# Patient Record
Sex: Male | Born: 1985 | Race: White | Hispanic: No | Marital: Married | Smoking: Current every day smoker
Health system: Southern US, Community
[De-identification: ages and names within clinical notes are randomized; demographics above are authoritative.]

## PROBLEM LIST (undated history)

## (undated) DIAGNOSIS — E119 Type 2 diabetes mellitus without complications: Secondary | ICD-10-CM

## (undated) DIAGNOSIS — I10 Essential (primary) hypertension: Secondary | ICD-10-CM

## (undated) DIAGNOSIS — F419 Anxiety disorder, unspecified: Secondary | ICD-10-CM

## (undated) HISTORY — PX: MOUTH SURGERY: SHX715

## (undated) HISTORY — DX: Type 2 diabetes mellitus without complications: E11.9

## (undated) NOTE — ED Provider Notes (Signed)
 Formatting of this note is different from the original.   EMERGENCY DEPARTMENT ENCOUNTER    CHIEF COMPLAINT   Chief Complaint  Patient presents with  ? Dental Pain    Lower left molar tooth cracked and painful. Thinks it is abscessed.  ? Hypertension    BP 227/116 at triage. Hx of HTN. Takes Lisinopril  and amlodipine  - no missed doses or recent medication changes. Pt states it is elevated d/t pain   HPI   Douglas Jackson is a 66 y.o. male who presents with left lower toothache that started last night and has progressively worsen.  Patient states that he broke that tooth months ago, but has never given him any issues.  Denies fevers or chills.  External Prior Records Reviewed:  none  Additional History Obtained:  none  PAST MEDICAL HISTORY   Past Medical History:  Diagnosis Date  ? Diabetes mellitus (HCC)   ? Hypertension   ? Personal history of kidney stones    SURGICAL HISTORY   Past Surgical History:  Procedure Laterality Date  ? WISDOM TOOTH EXTRACTION     CURRENT MEDICATIONS   No current facility-administered medications for this encounter.   Current Outpatient Medications  Medication Sig Dispense Refill  ? DULoxetine (CYMBALTA) 20 MG capsule Take 20 mg by mouth daily.    ? glipiZIDE  (GLUCOTROL ) 5 MG tablet Take 5 mg by mouth 2 (two) times daily before meals.    ? HYDROcodone -acetaminophen  (NORCO) 5-325 mg per tablet Take 1 tablet by mouth every 6 (six) hours as needed for Pain for up to 12 doses. 12 tablet 0  ? lisinopril  (PRINIVIL ,ZESTRIL ) 20 MG tablet Take 20 mg by mouth daily.    ? metFORMIN  (GLUCOPHAGE ) 500 MG tablet Take 500 mg by mouth 2 (two) times daily with meals.    ? methylPREDNISolone (MEDROL DOSEPACK) 4 mg tablet Follow instructions on package. 21 tablet 0  ? oxyCODONE  (ROXICODONE ) 5 MG immediate release tablet Take 1 tablet (5 mg total) by mouth every 6 (six) hours as needed. 15 tablet 0  ? tamsulosin  (FLOMAX ) 0.4 mg capsule Take 1 capsule (0.4 mg  total) by mouth daily for 7 days. 7 capsule 0  ? valACYclovir (VALTREX) 1000 MG tablet Take 1 tablet (1,000 mg total) by mouth 3 (three) times daily for 7 days. 21 tablet 0   ALLERGIES   Allergies  Allergen Reactions  ? Toradol  [Ketorolac ] Stevens-Johnson syndrome  ? Prednisone Hives   FAMILY HISTORY   No family history on file.  SOCIAL HISTORY   Social History   Socioeconomic History  ? Marital status: Married    Spouse name: Not on file  ? Number of children: Not on file  ? Years of education: Not on file  ? Highest education level: Not on file  Occupational History  ? Not on file  Tobacco Use  ? Smoking status: Every Day    Packs/day: 0.50    Years: 10.00    Pack years: 5.00    Types: Cigarettes  ? Smokeless tobacco: Never  Vaping Use  ? Vaping Use: Never used  Substance and Sexual Activity  ? Alcohol use: Not Currently  ? Drug use: Yes    Types: Marijuana  ? Sexual activity: Yes    Partners: Female  Other Topics Concern  ? Not on file  Social History Narrative  ? Not on file   Social Determinants of Health   Financial Resource Strain: Not on file  Food Insecurity: Not on file  Transportation Needs: Not on file  Social Connections: Not on file  Housing Stability: Not on file   REVIEW OF SYSTEMS   See HPI for further details.  All other systems reviewed and otherwise negative.  PHYSICAL EXAM   VITAL SIGNS: BP (S) (!) 227/116 (BP Location: Right upper arm, Patient Position: Sitting)   Pulse 94   Temp 98.3 F (36.8 C) (Oral)   Resp 18   Ht 5' 9 (1.753 m)   Wt 245 lb (111 kg)   SpO2 100%   BMI 36.18 kg/m  Constitutional:  Well developed and well nourished.  No acute distress.  Non-toxic in appearance.   Eyes:  PERRL. EOMI.  Conjunctiva normal.  No discharge. HENT:  Atraumatic.  Normocephalic.  Ears normal bilaterally.  Nares clear bilaterally.  Normal oropharynx without erythema.  Obvious dental caries to left lower back molar.  No swelling or  fluctuance.  No facial swelling or erythema. Neck:  Normal inspection.  Normal range of motion.  Supple.  No meningeal signs.  No lymphadenopathy. Respiratory:  Normal breath sounds.  No respiratory distress.  No wheezing or rhonchi.  No chest tenderness.  Cardiovascular:  Normal heart rate and rhythm.  Equal pulses. Extremities:  Intact distal pulses.  No edema.  No tenderness.  No cyanosis.  Normal range of motion in all major joints. No tenderness to palpation.  No major deformities noted.  Back: No midline or CVA tenderness.  Skin:  Warm and dry.  No erythema.  No rash.  Neurologic:  Awake and alert.  GCS 15.  ED COURSE & MEDICAL DECISION MAKING   46 year old male patient here with left lower toothache that radiates into his left ear that started last night.  He has a known dental caries in that area.  He has not been on antibiotics recently.  He was also found to have elevated blood pressure.  Patient reports taking his medications as prescribed.  Patient states that his blood pressure typically goes up when he is in pain.  Denies chest pain or shortness of breath.  Denies headache.  No indication for further workup it for his blood pressure.  He was given hydrocodone  while in the emergency department.  He is afebrile.  Stable for discharge home.  He was instructed to follow up with his primary care physician for hypertension and dentist as scheduled.  Differential diagnosis includes, but not limited to dental abscess, facial cellulitis,  Medications Provided in Emergency Department:  Hydrocodone   Prescriptions Provided at Discharge:  Amoxicillin  Social Determinants of Health/Special Considerations:  None identified  Disposition:  Stable for discharge home  FINAL IMPRESSION   Dental abscess Hypertension  Portions of this note may be dictated using voice recognition software.  Variances in spelling and vocabulary are possible and unintentional.  Not all errors are caught/corrected.   Please notify the dino if any discrepancies are noted or if the meaning of any statement is not clear.  Wanda Servant, NP 03/14/21 1502  Cosigned by Gwynne KATHEE Koleen DOUGLAS, MD at 03/14/2021  7:22 PM EST Electronically signed by Wanda Servant, NP at 03/14/2021  3:02 PM EST Electronically signed by Gwynne KATHEE Koleen DOUGLAS, MD at 03/14/2021  7:22 PM EST

---

## 2009-04-02 ENCOUNTER — Emergency Department (HOSPITAL_COMMUNITY): Admission: EM | Admit: 2009-04-02 | Discharge: 2009-04-02 | Payer: Self-pay | Admitting: Emergency Medicine

## 2013-09-01 ENCOUNTER — Emergency Department (HOSPITAL_COMMUNITY)
Admission: EM | Admit: 2013-09-01 | Discharge: 2013-09-01 | Disposition: A | Discharge status: Home / Self Care | Payer: Self-pay | Attending: Emergency Medicine | Admitting: Emergency Medicine

## 2013-09-01 ENCOUNTER — Encounter (HOSPITAL_COMMUNITY): Payer: Self-pay | Admitting: Emergency Medicine

## 2013-09-01 ENCOUNTER — Emergency Department (HOSPITAL_COMMUNITY): Payer: Self-pay

## 2013-09-01 DIAGNOSIS — Z87891 Personal history of nicotine dependence: Secondary | ICD-10-CM | POA: Insufficient documentation

## 2013-09-01 DIAGNOSIS — Y9301 Activity, walking, marching and hiking: Secondary | ICD-10-CM | POA: Insufficient documentation

## 2013-09-01 DIAGNOSIS — Y9289 Other specified places as the place of occurrence of the external cause: Secondary | ICD-10-CM | POA: Insufficient documentation

## 2013-09-01 DIAGNOSIS — Z8659 Personal history of other mental and behavioral disorders: Secondary | ICD-10-CM | POA: Insufficient documentation

## 2013-09-01 DIAGNOSIS — S46909A Unspecified injury of unspecified muscle, fascia and tendon at shoulder and upper arm level, unspecified arm, initial encounter: Secondary | ICD-10-CM | POA: Insufficient documentation

## 2013-09-01 DIAGNOSIS — I10 Essential (primary) hypertension: Secondary | ICD-10-CM | POA: Insufficient documentation

## 2013-09-01 DIAGNOSIS — S4980XA Other specified injuries of shoulder and upper arm, unspecified arm, initial encounter: Secondary | ICD-10-CM | POA: Insufficient documentation

## 2013-09-01 DIAGNOSIS — W010XXA Fall on same level from slipping, tripping and stumbling without subsequent striking against object, initial encounter: Secondary | ICD-10-CM | POA: Insufficient documentation

## 2013-09-01 DIAGNOSIS — M25511 Pain in right shoulder: Secondary | ICD-10-CM

## 2013-09-01 HISTORY — DX: Essential (primary) hypertension: I10

## 2013-09-01 HISTORY — DX: Anxiety disorder, unspecified: F41.9

## 2013-09-01 MED ORDER — HYDROCODONE-ACETAMINOPHEN 5-325 MG PO TABS: 1.0000 | ORAL_TABLET | ORAL | Status: DC | PRN

## 2013-09-01 NOTE — ED Notes (Signed)
C/o right shoulder pain since fall 2 days ago. States he slipped in the kitchen.

## 2013-09-01 NOTE — ED Notes (Signed)
No XL slings in stock. Called ortho tech.

## 2013-09-01 NOTE — ED Provider Notes (Signed)
Medical screening examination/treatment/procedure(s) were performed by non-physician practitioner and as supervising physician I was immediately available for consultation/collaboration.   EKG Interpretation None       Rigby Leonhardt M Marty Sadlowski, MD 09/01/13 2229 

## 2013-09-01 NOTE — Progress Notes (Signed)
Orthopedic Tech Progress Note Patient Details:  Douglas Jackson 04/04/1985 [id number]  Ortho Devices Type of Ortho Device: Arm sling Ortho Device/Splint Location: RUE Ortho Device/Splint Interventions: Application   Asia R Thompson 09/01/2013, 12:54 PM

## 2013-09-01 NOTE — ED Provider Notes (Signed)
CSN: [id number]     Arrival date & time 09/01/13  1117 History  This chart was scribed for Douglas DredgeEmily Ardyn Forge, PA-C, non-physician practitioner working with Hurman HornJohn M Bednar, MD by Nicholos Johnsenise Iheanachor, ED scribe. This patient was seen in room TR06C/TR06C and the patient's care was started at 12:27 PM.    Chief Complaint  Patient presents with  . Shoulder Injury   The history is provided by the patient. No language interpreter was used.   HPI Comments: Douglas Jackson is a 28 y.o. male who presents to the Emergency Department complaining of right shoulder injury; onset 3 days ago. No LOC or head injury. Notes a constant burning diffuse pain with or without movement. Pain is exacerbated by movement. . States he slipped on water walking in the kitchen. [name] backwards and landed on shoulder. No other injuries from this fall. Has been using ibuprofen to treat without relief. Not followed by an orthopedist. Denies numbness or weakness.  Past Medical History  Diagnosis Date  . Hypertension   . Anxiety    History reviewed. No pertinent past surgical history. History reviewed. No pertinent family history. History  Substance Use Topics  . Smoking status: Former Games developermoker  . Smokeless tobacco: Not on file  . Alcohol Use: No    Review of Systems  Musculoskeletal: Positive for arthralgias.  Neurological: Negative for weakness and numbness.  All other systems reviewed and are negative.   Allergies  Celestone  Home Medications   Prior to Admission medications   Not on File   Triage vitals: BP 139/87  Pulse 113  Temp(Src) 98.3 F (36.8 C) (Oral)  Resp 20  Ht 5\' 9"  (1.753 m)  Wt 268 lb (121.564 kg)  BMI 39.56 kg/m2  SpO2 97%  Physical Exam  Nursing note and vitals reviewed. Constitutional: He appears well-developed and well-nourished. No distress.  HENT:  Head: Normocephalic and atraumatic.  Neck: Neck supple.  Pulmonary/Chest: Effort normal.  Musculoskeletal:       Right shoulder: He exhibits  tenderness and pain. He exhibits normal range of motion.  Diffuse tenderness throughout upper right shoulder and upper right back. Clavicle non-tender. Tender over AC joint. Upper extremities: Strength 5/5, sensation intact, distal pulses intact.     Neurological: He is alert.  Skin: He is not diaphoretic.    ED Course  Procedures (including critical care time) DIAGNOSTIC STUDIES: Oxygen Saturation is 97% on room air, normal by my interpretation.    COORDINATION OF CARE: At 12:33 PM: Discussed treatment plan with patient which includes prescription for pain medication. Pt will be placed in a right arm sling to immobilize the shoulder. Discussed with patient to continue icing and elevating the area to decrease swelling and encourage healing. Patient agrees.    Labs Review Labs Reviewed - No data to display  Imaging Review Dg Shoulder Right  09/01/2013   CLINICAL DATA:  Pain post trauma  EXAM: RIGHT SHOULDER - 2+ VIEW  COMPARISON:  None.  FINDINGS: Frontal, Y scapular, and axillary images were obtained. No fracture or dislocation. Joint spaces appear intact. No erosive change.  IMPRESSION: No abnormality noted.   Electronically Signed   By: Bretta BangWilliam  Woodruff M.D.   On: 09/01/2013 12:20    EKG Interpretation None      MDM   Final diagnoses:  Right shoulder pain    Afebrile nontoxic patient with diffuse right shoulder pain without overlying skin changes, ecchymosis, abrasion or laceration.  Xray is negative.  Neurovascularly intact.  Tender everywhere, including  over [facility] joint.  Placed in sling.  D/C home with pain medication. PCP follow up.  Discussed result, findings, treatment, and follow up  with patient.  Pt given return precautions.  Pt verbalizes understanding and agrees with plan.      I personally performed the services described in this documentation, which was scribed in my presence. The recorded information has been reviewed and is accurate.     Douglas Dredge, PA-C 09/01/13  1320

## 2013-09-01 NOTE — ED Notes (Signed)
Per pt sts that he fell on his right shoulder 3 days ago. sts slipped in the kitchen. Limited movement due to pain. sts also that his nose has been bleeding randomly. No bleeding currently.

## 2013-09-01 NOTE — Discharge Instructions (Signed)
Read the information below.  Use the prescribed medication as directed.  Please discuss all new medications with your pharmacist.  Do not take additional tylenol while taking the prescribed pain medication to avoid overdose.  You may return to the Emergency Department at any time for worsening condition or any new symptoms that concern you.  If you develop uncontrolled pain, weakness or numbness of the extremity, severe discoloration of the skin, or you are unable to move your arm, return to the ER for a recheck.      Acromioclavicular Injuries The AC (acromioclavicular) joint is the joint in the shoulder where the collarbone (clavicle) meets the shoulder blade (scapula). The part of the shoulder blade connected to the collarbone is called the acromion. Common problems with and treatments for the [facility] joint are detailed below. ARTHRITIS Arthritis occurs when the joint has been injured and the smooth padding between the joints (cartilage) is lost. This is the wear and tear seen in most joints of the body if they have been overused. This causes the joint to produce pain and swelling which is worse with activity.  AC JOINT SEPARATION AC joint separation means that the ligaments connecting the acromion of the shoulder blade and collarbone have been damaged, and the two bones no longer line up. AC separations can be anywhere from mild to severe, and are "graded" depending upon which ligaments are torn and how badly they are torn.  Grade I Injury: the least damage is done, and the [facility] joint still lines up.  Grade II Injury: damage to the ligaments which reinforce the [facility] joint. In a Grade II injury, these ligaments are stretched but not entirely torn. When stressed, the [facility] joint becomes painful and unstable.  Grade III Injury: AC and secondary ligaments are completely torn, and the collarbone is no longer attached to the shoulder blade. This results in deformity; a prominence of the end of the clavicle. AC  JOINT FRACTURE AC joint fracture means that there has been a break in the bones of the [facility] joint, usually the end of the clavicle. TREATMENT TREATMENT OF AC ARTHRITIS  There is currently no way to replace the cartilage damaged by arthritis. The best way to improve the condition is to decrease the activities which aggravate the problem. Application of ice to the joint helps decrease pain and soreness (inflammation). The use of non-steroidal anti-inflammatory medication is helpful.  If less conservative measures do not work, then cortisone shots (injections) may be used. These are anti-inflammatories; they decrease the soreness in the joint and swelling.  If non-surgical measures fail, surgery may be recommended. The procedure is generally removal of a portion of the end of the clavicle. This is the part of the collarbone closest to your acromion which is stabilized with ligaments to the acromion of the shoulder blade. This surgery may be performed using a tube-like instrument with a light (arthroscope) for looking into a joint. It may also be performed as an open surgery through a small incision by the surgeon. Most patients will have good range of motion within 6 weeks and may return to all activity including sports by 8-12 weeks, barring complications. TREATMENT OF AN AC SEPARATION  The initial treatment is to decrease pain. This is best accomplished by immobilizing the arm in a sling and placing an ice pack to the shoulder for 20 to 30 minutes every 2 hours as needed. As the pain starts to subside, it is important to begin moving the fingers, wrist,  elbow and eventually the shoulder in order to prevent a stiff or "frozen" shoulder. Instruction on when and how much to move the shoulder will be provided by your caregiver. The length of time needed to regain full motion and function depends on the amount or grade of the injury. Recovery from a Grade I AC separation usually takes 10 to 14 days, whereas a  Grade III may take 6 to 8 weeks.  Grade I and II separations usually do not require surgery. Even Grade III injuries usually allow return to full activity with few restrictions. Treatment is also based on the activity demands of the injured shoulder. For example, a high level quarterback with an injured throwing arm will receive more aggressive treatment than someone with a desk job who rarely uses his/her arm for strenuous activities. In some cases, a painful lump may persist which could require a later surgery. Surgery can be very successful, but the benefits must be weighed against the potential risks. TREATMENT OF AN AC JOINT FRACTURE Fracture treatment depends on the type of fracture. Sometimes a splint or sling may be all that is required. Other times surgery may be required for repair. This is more frequently the case when the ligaments supporting the clavicle are completely torn. Your caregiver will help you with these decisions and together you can decide what will be the best treatment. HOME CARE INSTRUCTIONS   Apply ice to the injury for 15-20 minutes each hour while awake for 2 days. Put the ice in a plastic bag and place a towel between the bag of ice and skin.  If a sling has been applied, wear it constantly for as long as directed by your caregiver, even at night. The sling or splint can be removed for bathing or showering or as directed. Be sure to keep the shoulder in the same place as when the sling is on. Do not lift the arm.  If a figure-of-eight splint has been applied it should be tightened gently by another person every day. Tighten it enough to keep the shoulders held back. Allow enough room to place the index finger between the body and strap. Loosen the splint immediately if there is numbness or tingling in the hands.  Take over-the-counter or prescription medicines for pain, discomfort or fever as directed by your caregiver.  If you or your child has received a follow up  appointment, it is very important to keep that appointment in order to avoid long term complications, chronic pain or disability. SEEK MEDICAL CARE IF:   The pain is not relieved with medications.  There is increased swelling or discoloration that continues to get worse rather than better.  You or your child has been unable to follow up as instructed.  There is progressive numbness and tingling in the arm, forearm or hand. SEEK IMMEDIATE MEDICAL CARE IF:   The arm is numb, cold or pale.  There is increasing pain in the hand, forearm or fingers. MAKE SURE YOU:   Understand these instructions.  Will watch your condition.  Will get help right away if you are not doing well or get worse. Document Released: 11/02/2004 Document Revised: 04/17/2011 Document Reviewed: 04/27/2008 [facility] Patient Information 2015 [place], [place]. This information is not intended to replace advice given to you by your health care provider. Make sure you discuss any questions you have with your health care provider.  Shoulder Pain The shoulder is the joint that connects your arms to your body. The bones that  form the shoulder joint include the upper arm bone (humerus), the shoulder blade (scapula), and the collarbone (clavicle). The top of the humerus is shaped like a ball and fits into a rather flat socket on the scapula (glenoid cavity). A combination of muscles and strong, fibrous tissues that connect muscles to bones (tendons) support your shoulder joint and hold the ball in the socket. Small, fluid-filled sacs (bursae) are located in different areas of the joint. They act as cushions between the bones and the overlying soft tissues and help reduce friction between the gliding tendons and the bone as you move your arm. Your shoulder joint allows a wide range of motion in your arm. This range of motion allows you to do things like scratch your back or throw a ball. However, this range of motion also makes your  shoulder more prone to pain from overuse and injury. Causes of shoulder pain can originate from both injury and overuse and usually can be grouped in the following four categories:  Redness, swelling, and pain (inflammation) of the tendon (tendinitis) or the bursae (bursitis).  Instability, such as a dislocation of the joint.  Inflammation of the joint (arthritis).  Broken bone (fracture). HOME CARE INSTRUCTIONS   Apply ice to the sore area.  Put ice in a plastic bag.  Place a towel between your skin and the bag.  Leave the ice on for 15-20 minutes, 3-4 times per day for the first 2 days, or as directed by your health care provider.  Stop using cold packs if they do not help with the pain.  If you have a shoulder sling or immobilizer, wear it as long as your caregiver instructs. Only remove it to shower or bathe. Move your arm as little as possible, but keep your hand moving to prevent swelling.  Squeeze a soft ball or foam pad as much as possible to help prevent swelling.  Only take over-the-counter or prescription medicines for pain, discomfort, or fever as directed by your caregiver. SEEK MEDICAL CARE IF:   Your shoulder pain increases, or new pain develops in your arm, hand, or fingers.  Your hand or fingers become cold and numb.  Your pain is not relieved with medicines. SEEK IMMEDIATE MEDICAL CARE IF:   Your arm, hand, or fingers are numb or tingling.  Your arm, hand, or fingers are significantly swollen or turn white or blue. MAKE SURE YOU:   Understand these instructions.  Will watch your condition.  Will get help right away if you are not doing well or get worse. Document Released: 11/02/2004 Document Revised: 06/09/2013 Document Reviewed: 01/07/2011 [facility] Patient Information 2015 [place], [place]. This information is not intended to replace advice given to you by your health care provider. Make sure you discuss any questions you have with your health care  provider.  Shoulder Range of Motion Exercises The shoulder is the most flexible joint in the human body. Because of this it is also the most unstable joint in the body. All ages can develop shoulder problems. Early treatment of problems is necessary for a good outcome. People react to shoulder pain by decreasing the movement of the joint. After a brief period of time, the shoulder can become "frozen". This is an almost complete loss of the ability to move the damaged shoulder. Following injuries your caregivers can give you instructions on exercises to keep your range of motion (ability to move your shoulder freely), or regain it if it has been lost.  EXERCISES EXERCISES  TO MAINTAIN THE MOBILITY OF YOUR SHOULDER: Codman's Exercise or Pendulum Exercise  This exercise may be performed in a prone (face-down) lying position or standing while leaning on a chair with the opposite arm. Its purpose is to relax the muscles in your shoulder and slowly but surely increase the range of motion and to relieve pain.  Lie on your stomach close to the side edge of the bed. Let your weak arm hang over the edge of the bed. Relax your shoulder, arm and hand. Let your shoulder blade relax and drop down.  Slowly and gently swing your arm forward and back. Do not use your neck muscles; relax them. It might be easier to have someone else gently start swinging your arm.  As pain decreases, increase your swing. To start, arm swing should begin at 15 degree angles. In time and as pain lessens, move to 30-45 degree angles. Start with swinging for about 15 seconds, and work towards swinging for 3 to 5 minutes.  This exercise may also be performed in a standing/bent over position.  Stand and hold onto a sturdy chair with your good arm. Bend forward at the waist and bend your knees slightly to help protect your back. Relax your weak arm, let it hang limp. Relax your shoulder blade and let it drop.  Keep your shoulder relaxed  and use body motion to swing your arm in small circles.  Stand up tall and relax.  Repeat motion and change direction of circles.  Start with swinging for about 30 seconds, and work towards swinging for 3 to 5 minutes. STRETCHING EXERCISES:  Lift your arm out in front of you with the elbow bent at 90 degrees. Using your other arm gently pull the elbow forward and across your body.  Bend one arm behind you with the palm facing outward. Using the other arm, hold a towel or rope and reach this arm up above your head, then bend it at the elbow to move your wrist to behind your neck. Grab the free end of the towel with the hand behind your back. Gently pull the towel up with the hand behind your neck, gradually increasing the pull on the hand behind the small of your back. Then, gradually pull down with the hand behind the small of your back. This will pull the hand and arm behind your neck further. Both shoulders will have an increased range of motion with repetition of this exercise. STRENGTHENING EXERCISES:  Standing with your arm at your side and straight out from your shoulder with the elbow bent at 90 degrees, hold onto a small weight and slowly raise your hand so it points straight up in the air. Repeat this five times to begin with, and gradually increase to ten times. Do this four times per day. As you grow stronger you can gradually increase the weight.  Repeat the above exercise, only this time using an elastic band. Start with your hand up in the air and pull down until your hand is by your side. As you grow stronger, gradually increase the amount you pull by increasing the number or size of the elastic bands. Use the same amount of repetitions.  Standing with your hand at your side and holding onto a weight, gradually lift the hand in front of you until it is over your head. Do the same also with the hand remaining at your side and lift the hand away from your body until it is again over  your head. Repeat this five times to begin with, and gradually increase to ten times. Do this four times per day. As you grow stronger you can gradually increase the weight. Document Released: 10/22/2002 Document Revised: 01/28/2013 Document Reviewed: 01/23/2005 [facility] Patient Information 2015 [place], [place]. This information is not intended to replace advice given to you by your health care provider. Make sure you discuss any questions you have with your health care provider.

## 2013-12-13 ENCOUNTER — Emergency Department (HOSPITAL_COMMUNITY): Payer: Self-pay

## 2013-12-13 ENCOUNTER — Emergency Department (HOSPITAL_COMMUNITY)
Admission: EM | Admit: 2013-12-13 | Discharge: 2013-12-13 | Disposition: A | Discharge status: Home / Self Care | Payer: Self-pay | Attending: Emergency Medicine | Admitting: Emergency Medicine

## 2013-12-13 ENCOUNTER — Encounter (HOSPITAL_COMMUNITY): Payer: Self-pay | Admitting: *Deleted

## 2013-12-13 DIAGNOSIS — R112 Nausea with vomiting, unspecified: Secondary | ICD-10-CM | POA: Insufficient documentation

## 2013-12-13 DIAGNOSIS — R739 Hyperglycemia, unspecified: Secondary | ICD-10-CM | POA: Insufficient documentation

## 2013-12-13 DIAGNOSIS — R059 Cough, unspecified: Secondary | ICD-10-CM

## 2013-12-13 DIAGNOSIS — R1084 Generalized abdominal pain: Secondary | ICD-10-CM | POA: Insufficient documentation

## 2013-12-13 DIAGNOSIS — R05 Cough: Secondary | ICD-10-CM

## 2013-12-13 DIAGNOSIS — J208 Acute bronchitis due to other specified organisms: Secondary | ICD-10-CM | POA: Insufficient documentation

## 2013-12-13 DIAGNOSIS — I10 Essential (primary) hypertension: Secondary | ICD-10-CM | POA: Insufficient documentation

## 2013-12-13 DIAGNOSIS — F419 Anxiety disorder, unspecified: Secondary | ICD-10-CM | POA: Insufficient documentation

## 2013-12-13 DIAGNOSIS — Z79899 Other long term (current) drug therapy: Secondary | ICD-10-CM | POA: Insufficient documentation

## 2013-12-13 DIAGNOSIS — Z720 Tobacco use: Secondary | ICD-10-CM | POA: Insufficient documentation

## 2013-12-13 DIAGNOSIS — R197 Diarrhea, unspecified: Secondary | ICD-10-CM | POA: Insufficient documentation

## 2013-12-13 LAB — CBG MONITORING, ED: Glucose-Capillary: 196 mg/dL — ABNORMAL HIGH (ref 70–99)

## 2013-12-13 LAB — COMPREHENSIVE METABOLIC PANEL
ALBUMIN: 3.7 g/dL (ref 3.5–5.2)
ALK PHOS: 160 U/L — AB (ref 39–117)
ALT: 109 U/L — AB (ref 0–53)
AST: 41 U/L — ABNORMAL HIGH (ref 0–37)
Anion gap: 13 (ref 5–15)
BILIRUBIN TOTAL: 0.3 mg/dL (ref 0.3–1.2)
BUN: 10 mg/dL (ref 6–23)
CHLORIDE: 97 meq/L (ref 96–112)
CO2: 25 meq/L (ref 19–32)
CREATININE: 0.67 mg/dL (ref 0.50–1.35)
Calcium: 9.3 mg/dL (ref 8.4–10.5)
GFR calc Af Amer: >90 mL/min (ref >90)
Glucose, Bld: 256 mg/dL — ABNORMAL HIGH (ref 70–99)
POTASSIUM: 3.8 meq/L (ref 3.7–5.3)
SODIUM: 135 meq/L — AB (ref 137–147)
Total Protein: 8.2 g/dL (ref 6.0–8.3)

## 2013-12-13 LAB — CBC WITH DIFFERENTIAL/PLATELET
BASOS ABS: 0 10*3/uL (ref 0.0–0.1)
BASOS PCT: 0 % (ref 0–1)
Eosinophils Absolute: 0.3 10*3/uL (ref 0.0–0.7)
Eosinophils Relative: 2 % (ref 0–5)
HEMATOCRIT: 45 % (ref 39.0–52.0)
Hemoglobin: 15.7 g/dL (ref 13.0–17.0)
LYMPHS PCT: 39 % (ref 12–46)
Lymphs Abs: 4.3 10*3/uL — ABNORMAL HIGH (ref 0.7–4.0)
MCH: 28.9 pg (ref 26.0–34.0)
MCHC: 34.9 g/dL (ref 30.0–36.0)
MCV: 82.9 fL (ref 78.0–100.0)
MONO ABS: 0.6 10*3/uL (ref 0.1–1.0)
Monocytes Relative: 6 % (ref 3–12)
NEUTROS ABS: 6 10*3/uL (ref 1.7–7.7)
NEUTROS PCT: 53 % (ref 43–77)
Platelets: 245 10*3/uL (ref 150–400)
RBC: 5.43 MIL/uL (ref 4.22–5.81)
RDW: 12.4 % (ref 11.5–15.5)
WBC: 11.2 10*3/uL — AB (ref 4.0–10.5)

## 2013-12-13 LAB — LIPASE, BLOOD: Lipase: 27 U/L (ref 11–59)

## 2013-12-13 MED ORDER — ONDANSETRON HCL 4 MG/2ML IJ SOLN
4.0000 mg | Freq: Once | INTRAMUSCULAR | Status: AC
  Administered 2013-12-13: 4 mg via INTRAVENOUS
  Filled 2013-12-13: qty 2

## 2013-12-13 MED ORDER — KETOROLAC TROMETHAMINE 30 MG/ML IJ SOLN
30.0000 mg | Freq: Once | INTRAMUSCULAR | Status: AC
  Administered 2013-12-13: 30 mg via INTRAVENOUS
  Filled 2013-12-13: qty 1

## 2013-12-13 MED ORDER — ALBUTEROL SULFATE HFA 108 (90 BASE) MCG/ACT IN AERS: 2.0000 | INHALATION_SPRAY | RESPIRATORY_TRACT | Status: DC | PRN

## 2013-12-13 MED ORDER — ONDANSETRON HCL 4 MG PO TABS: 4.0000 mg | ORAL_TABLET | Freq: Three times a day (TID) | ORAL | Status: DC | PRN

## 2013-12-13 NOTE — ED Notes (Signed)
He also has anger issues he takes med for this

## 2013-12-13 NOTE — ED Notes (Signed)
The pt has numerus complaints for 2-3 weeks.  He is c/o vomiting pink chunks  Cold chest congestion  inknown temp productive cough.  He is currently a smoker and not feeling well

## 2013-12-13 NOTE — ED Notes (Signed)
CBG 196 

## 2013-12-13 NOTE — ED Notes (Signed)
NP at BS.

## 2013-12-13 NOTE — Discharge Instructions (Signed)
Please follow the directions provided. Be sure to follow-up with your primary care provider this week to follow-up on your symptoms and to follow-up on your blood sugar today of 256.  You may use the inhaler 2 puffs every 4 hours for coughing or shortness of breath.  Don't hesitate to return for new, worsening or concerning symptoms.     SEEK IMMEDIATE MEDICAL CARE IF:  You are unable to keep fluids down.  You have persistent vomiting.  You have blood in your stool, or your stools are black and tarry.  You do not urinate in 6-8 hours, or there is only a small amount of very dark urine.  You have abdominal pain that increases or localizes.  You have weakness, dizziness, confusion, or light-headedness.  You have a severe headache.  Your diarrhea gets worse or does not get better.  You have a fever or persistent symptoms for more than 2-3 days.  You have a fever and your symptoms suddenly get worse.

## 2013-12-13 NOTE — ED Provider Notes (Signed)
CSN: [id number]     Arrival date & time 12/13/13  0257 History   First MD Initiated Contact with Patient 12/13/13 [id number]     Chief Complaint  Patient presents with  . multiple complaints    . Emesis  . Cough    (Consider location/radiation/quality/duration/timing/severity/associated sxs/prior Treatment) HPI  Douglas Jackson is a a 28 yo male presenting with report of diarrhea x 2-3 weeks.  He reports 3-4 episodes of diarrhea per day daily.  Then 1 week ago he began to feel nauseated and began having episodes of vomiting.  Three days ago he had a episode of vomiting and then again last night while at work.  His bowel movements have been less watery and becoming softer. He also reports he has been coughing and nasal congestion x several weeks.  He feels like the cough could be productive if he could cough hard enough.  Pt denies abd pain except when having episode of diarrhea, chest pain, shortness of breath, bilious or bloody emesis or hematachezia or melena.    Past Medical History  Diagnosis Date  . Hypertension   . Anxiety    History reviewed. No pertinent past surgical history. No family history on file. History  Substance Use Topics  . Smoking status: Current Every Day Smoker  . Smokeless tobacco: Not on file  . Alcohol Use: Yes    Review of Systems  Constitutional: Positive for fever. Negative for chills.  HENT: Positive for congestion and rhinorrhea. Negative for sore throat.   Eyes: Negative for visual disturbance.  Respiratory: Positive for cough. Negative for shortness of breath.   Cardiovascular: Negative for chest pain and leg swelling.  Gastrointestinal: Positive for nausea, vomiting and diarrhea.  Genitourinary: Negative for dysuria.  Musculoskeletal: Negative for myalgias.  Skin: Negative for rash.  Neurological: Negative for weakness, numbness and headaches.     Allergies  Celestone  Home Medications   Prior to Admission medications   Medication Sig Start  Date End Date Taking? Authorizing Provider  lisinopril (PRINIVIL,ZESTRIL) 20 MG tablet Take 20 mg by mouth daily.   Yes Historical Provider, MD  sertraline (ZOLOFT) 50 MG tablet Take 50 mg by mouth daily.  10/30/13  Yes Historical Provider, MD  HYDROcodone-acetaminophen (NORCO/VICODIN) 5-325 MG per tablet Take 1 tablet by mouth every 4 (four) hours as needed for moderate pain or severe pain. 09/01/13   Trixie DredgeEmily West, PA-C   BP 123/58 mmHg  Pulse 85  Temp(Src) 98.1 F (36.7 C) (Oral)  Resp 20  SpO2 98% Physical Exam  Constitutional: He is oriented to person, place, and time. He appears well-developed and well-nourished. No distress.  HENT:  Head: Normocephalic and atraumatic.  Mouth/Throat: Oropharynx is clear and moist. No oropharyngeal exudate.  Eyes: Conjunctivae are normal. No scleral icterus.  Neck: Neck supple. No thyromegaly present.  Cardiovascular: Normal rate, regular rhythm and intact distal pulses.  Exam reveals no gallop and no friction rub.   No murmur heard. Pulmonary/Chest: Effort normal and breath sounds normal. No respiratory distress. He has no wheezes. He has no rales. He exhibits no tenderness.  Abdominal: Soft. Normal appearance. There is no hepatosplenomegaly. There is generalized tenderness. There is no rigidity, no rebound, no guarding, no CVA tenderness, no tenderness at McBurney's point and negative Murphy's sign.  Musculoskeletal: He exhibits no tenderness.  Lymphadenopathy:    He has no cervical adenopathy.  Neurological: He is alert and oriented to person, place, and time.  Skin: Skin is warm and dry. No rash  noted. He is not diaphoretic.  Psychiatric: He has a normal mood and affect.  Nursing note and vitals reviewed.   ED Course  Procedures (including critical care time) Labs Review Labs Reviewed  CBC WITH DIFFERENTIAL - Abnormal; Notable for the following:    WBC 11.2 (*)    Lymphs Abs 4.3 (*)    All other components within normal limits   COMPREHENSIVE METABOLIC PANEL - Abnormal; Notable for the following:    Sodium 135 (*)    Glucose, Bld 256 (*)    AST 41 (*)    ALT 109 (*)    Alkaline Phosphatase 160 (*)    All other components within normal limits  CBG MONITORING, ED - Abnormal; Notable for the following:    Glucose-Capillary 196 (*)    All other components within normal limits  LIPASE, BLOOD    Imaging Review Dg Chest 2 View  12/13/2013   CLINICAL DATA:  Initial evaluation for chest congestion, cough.  EXAM: CHEST  2 VIEW  COMPARISON:  None.  FINDINGS: The cardiac and mediastinal silhouettes are within normal limits.  The lungs are normally inflated. No airspace consolidation, pleural effusion, or pulmonary edema is identified. There is no pneumothorax.  No acute osseous abnormality identified.  IMPRESSION: No active cardiopulmonary disease.   Electronically Signed   By: Rise MuBenjamin  McClintock M.D.   On: 12/13/2013 04:25     EKG Interpretation None      MDM   Final diagnoses:  Nausea vomiting and diarrhea  Viral bronchitis  Hyperglycemia   28 yo with symptoms consistent with viral gastroenteritis. His vitals are stable, and is afebrile. His labs are unremarkable except for his glucose was 256. This was reported to the pt and he was made aware he needs to follow-up with his PCP regarding this finding.  He was given a NS bolus in the ED. His lungs are clear, there is no focal abdominal pain, no concern for appendicitis, cholecystitis, pancreatitis, ruptured viscus, UTI, kidney stone, or any other abdominal etiology.  He also reports nasal congestion and non-productive cough will provide albuterol MDI as supportive therapy. Discharge instructions include prescription for nausea medicine, follow-up with PCP and return if symptoms worsen.  Patient aware of plan and in agreement.  Return precautions provided.     Filed Vitals:   12/13/13 0554 12/13/13 0600 12/13/13 0630 12/13/13 0713  BP: 120/65 123/58  133/61   Pulse: 79 80 85 74  Temp:      TempSrc:      Resp:  20 20 18   SpO2: 99% 98% 98% 99%   Meds given in ED:  Medications  ketorolac (TORADOL) 30 MG/ML injection 30 mg (30 mg Intravenous Given 12/13/13 0711)  ondansetron (ZOFRAN) injection 4 mg (4 mg Intravenous Given 12/13/13 0711)    Discharge Medication List as of 12/13/2013  7:20 AM      Hide    Start     Ordered   12/13/13 0000  ondansetron (ZOFRAN) 4 MG tablet Every 8 hours PRN Discontinue Reprint 12/13/13 0720   12/13/13 0000  albuterol (PROVENTIL HFA;VENTOLIN HFA) 108 (90 BASE) MCG/ACT inhaler Every 4 hours PRN Discontinue Reprint 12/13/13 0720          Harle BattiestElizabeth Athens Lebeau, NP 12/13/13 [id number]  Olivia Mackielga M Otter, MD 12/13/13 1759

## 2015-09-10 ENCOUNTER — Encounter (HOSPITAL_COMMUNITY): Payer: Self-pay | Admitting: *Deleted

## 2015-09-10 ENCOUNTER — Emergency Department (HOSPITAL_COMMUNITY)
Admission: EM | Admit: 2015-09-10 | Discharge: 2015-09-11 | Disposition: A | Discharge status: Home / Self Care | Payer: Self-pay | Attending: Emergency Medicine | Admitting: Emergency Medicine

## 2015-09-10 DIAGNOSIS — G629 Polyneuropathy, unspecified: Secondary | ICD-10-CM

## 2015-09-10 DIAGNOSIS — E1165 Type 2 diabetes mellitus with hyperglycemia: Secondary | ICD-10-CM | POA: Insufficient documentation

## 2015-09-10 DIAGNOSIS — Z7984 Long term (current) use of oral hypoglycemic drugs: Secondary | ICD-10-CM | POA: Insufficient documentation

## 2015-09-10 DIAGNOSIS — Z79899 Other long term (current) drug therapy: Secondary | ICD-10-CM | POA: Insufficient documentation

## 2015-09-10 DIAGNOSIS — R739 Hyperglycemia, unspecified: Secondary | ICD-10-CM

## 2015-09-10 DIAGNOSIS — I10 Essential (primary) hypertension: Secondary | ICD-10-CM | POA: Insufficient documentation

## 2015-09-10 DIAGNOSIS — E1140 Type 2 diabetes mellitus with diabetic neuropathy, unspecified: Secondary | ICD-10-CM | POA: Insufficient documentation

## 2015-09-10 DIAGNOSIS — R202 Paresthesia of skin: Secondary | ICD-10-CM

## 2015-09-10 DIAGNOSIS — F17200 Nicotine dependence, unspecified, uncomplicated: Secondary | ICD-10-CM | POA: Insufficient documentation

## 2015-09-10 LAB — URINE MICROSCOPIC-ADD ON
RBC / HPF: NONE SEEN RBC/hpf (ref 0–5)
WBC, UA: NONE SEEN WBC/hpf (ref 0–5)

## 2015-09-10 LAB — RAPID URINE DRUG SCREEN, HOSP PERFORMED
Amphetamines: NOT DETECTED
Barbiturates: NOT DETECTED
Benzodiazepines: NOT DETECTED
COCAINE: NOT DETECTED
Opiates: NOT DETECTED
TETRAHYDROCANNABINOL: POSITIVE — AB

## 2015-09-10 LAB — COMPREHENSIVE METABOLIC PANEL
ALBUMIN: 3.7 g/dL (ref 3.5–5.0)
ALT: 81 U/L — ABNORMAL HIGH (ref 17–63)
AST: 45 U/L — AB (ref 15–41)
Alkaline Phosphatase: 135 U/L — ABNORMAL HIGH (ref 38–126)
Anion gap: 7 (ref 5–15)
BUN: 11 mg/dL (ref 6–20)
CALCIUM: 9.1 mg/dL (ref 8.9–10.3)
CO2: 26 mmol/L (ref 22–32)
Chloride: 103 mmol/L (ref 101–111)
Creatinine, Ser: 0.72 mg/dL (ref 0.61–1.24)
GFR calc Af Amer: >60 mL/min (ref >60)
GFR calc non Af Amer: >60 mL/min (ref >60)
Glucose, Bld: 308 mg/dL — ABNORMAL HIGH (ref 65–99)
POTASSIUM: 4.1 mmol/L (ref 3.5–5.1)
Sodium: 136 mmol/L (ref 135–145)
TOTAL PROTEIN: 6.9 g/dL (ref 6.5–8.1)
Total Bilirubin: 0.5 mg/dL (ref 0.3–1.2)

## 2015-09-10 LAB — CBC WITH DIFFERENTIAL/PLATELET
BASOS ABS: 0 10*3/uL (ref 0.0–0.1)
BASOS PCT: 0 %
Eosinophils Absolute: 0.2 10*3/uL (ref 0.0–0.7)
Eosinophils Relative: 2 %
HEMATOCRIT: 44.5 % (ref 39.0–52.0)
Hemoglobin: 15.8 g/dL (ref 13.0–17.0)
LYMPHS PCT: 29 %
Lymphs Abs: 4.1 10*3/uL — ABNORMAL HIGH (ref 0.7–4.0)
MCH: 30.3 pg (ref 26.0–34.0)
MCHC: 35.5 g/dL (ref 30.0–36.0)
MCV: 85.4 fL (ref 78.0–100.0)
Monocytes Absolute: 0.7 10*3/uL (ref 0.1–1.0)
Monocytes Relative: 5 %
NEUTROS ABS: 8.9 10*3/uL — AB (ref 1.7–7.7)
Neutrophils Relative %: 64 %
Platelets: 263 10*3/uL (ref 150–400)
RBC: 5.21 MIL/uL (ref 4.22–5.81)
RDW: 12.6 % (ref 11.5–15.5)
WBC: 14 10*3/uL — ABNORMAL HIGH (ref 4.0–10.5)

## 2015-09-10 LAB — URINALYSIS, ROUTINE W REFLEX MICROSCOPIC
BILIRUBIN URINE: NEGATIVE
Glucose, UA: >1000 mg/dL — AB
Hgb urine dipstick: NEGATIVE
Ketones, ur: NEGATIVE mg/dL
Leukocytes, UA: NEGATIVE
NITRITE: NEGATIVE
PROTEIN: NEGATIVE mg/dL
SPECIFIC GRAVITY, URINE: 1.036 — AB (ref 1.005–1.030)
pH: 6 (ref 5.0–8.0)

## 2015-09-10 LAB — I-STAT CG4 LACTIC ACID, ED: Lactic Acid, Venous: 2.14 mmol/L (ref 0.5–1.9)

## 2015-09-10 LAB — CBG MONITORING, ED: GLUCOSE-CAPILLARY: 304 mg/dL — AB (ref 65–99)

## 2015-09-10 MED ORDER — SODIUM CHLORIDE 0.9 % IV BOLUS (SEPSIS)
1000.0000 mL | INTRAVENOUS | Status: AC
  Administered 2015-09-11: 1000 mL via INTRAVENOUS

## 2015-09-10 NOTE — ED Notes (Addendum)
Lactic 2.14 as reported by lab

## 2015-09-10 NOTE — ED Notes (Signed)
Dr Clydene Pugh aware of lactic

## 2015-09-10 NOTE — ED Triage Notes (Signed)
Pt reports bilateral hand numbness of 2 weeks. Numbness started in fingers, now has moved up to his elbows. Pt states he has felt sluggish all day today. Pt denies any other symptoms. Pt reports a mild headache this morning that last 10 minutes. Pt works in a [employer], denies any recent exposure to chemicals.

## 2015-09-11 ENCOUNTER — Emergency Department (HOSPITAL_COMMUNITY): Payer: Self-pay

## 2015-09-11 LAB — I-STAT CG4 LACTIC ACID, ED: Lactic Acid, Venous: 1.12 mmol/L (ref 0.5–1.9)

## 2015-09-11 LAB — CBG MONITORING, ED: GLUCOSE-CAPILLARY: 233 mg/dL — AB (ref 65–99)

## 2015-09-11 MED ORDER — LORAZEPAM 2 MG/ML IJ SOLN
1.0000 mg | Freq: Once | INTRAMUSCULAR | Status: AC
  Administered 2015-09-11: 1 mg via INTRAVENOUS
  Filled 2015-09-11: qty 1

## 2015-09-11 MED ORDER — IBUPROFEN 800 MG PO TABS
800.0000 mg | ORAL_TABLET | Freq: Once | ORAL | Status: AC
  Administered 2015-09-11: 800 mg via ORAL
  Filled 2015-09-11: qty 1

## 2015-09-11 MED ORDER — SODIUM CHLORIDE 0.9 % IV BOLUS (SEPSIS)
1000.0000 mL | INTRAVENOUS | Status: AC
  Administered 2015-09-11: 1000 mL via INTRAVENOUS

## 2015-09-11 MED ORDER — GADOBENATE DIMEGLUMINE 529 MG/ML IV SOLN
20.0000 mL | Freq: Once | INTRAVENOUS | Status: AC | PRN
  Administered 2015-09-11: 20 mL via INTRAVENOUS

## 2015-09-11 NOTE — ED Notes (Signed)
Pt to MRI

## 2015-09-11 NOTE — ED Provider Notes (Signed)
[facility]-EMERGENCY DEPT Provider Note   CSN: [id number] Arrival date & time: 09/10/15  2128  First Provider Contact:  First MD Initiated Contact with Patient 09/10/15 2343        History   Chief Complaint Chief Complaint  Patient presents with  . Numbness    HPI Douglas Jackson is a 30 y.o. male.  Douglas Jackson is a 30 y.o. male  with a hx of hypertension, anxiety, non-insulin-dependent diabetes on metformin presents to the Emergency Department complaining of gradual, persistent, progressively worsening numbness and tingling in the bilateral hands.  He reports tingling appeared in the right hand 2-3 weeks ago but 5 days ago he began to experience numbness in the right hand. He reports numbness progressed to approximately mid forearm and then 3 days ago he experienced left hand numbness migrating to the midforearm. She reports symptoms are constant. No waxing, waning or resolving. Patient denies weakness in his hands or arms. No numbness, tingling or weakness in the legs. No neck pain or back pain. No falls or known trauma. Patient reports mother has a history of MS. Patient has never had MS diagnosis. Denies difficulty breathing, chest pain, shortness of breath, abdominal pain, nausea, vomiting. Patient reports metformin causes multiple loose stools per day but this remains unchanged.  No aggravating or alleviating factors. Patient denies fever, chills, vision changes, speech changes.  No recent viral illness.  No new medications.     The history is provided by the patient and medical records. No language interpreter was used.    Past Medical History:  Diagnosis Date  . Anxiety   . Hypertension     There are no active problems to display for this patient.   History reviewed. No pertinent surgical history.     Home Medications    Prior to Admission medications   Medication Sig Start Date End Date Taking? Authorizing Provider  B Complex Vitamins (VITAMIN B COMPLEX) TABS Take 1  tablet by mouth 2 (two) times daily.   Yes Historical Provider, MD  lisinopril (PRINIVIL,ZESTRIL) 10 MG tablet Take 10 mg by mouth daily.   Yes Historical Provider, MD  metFORMIN (GLUCOPHAGE) 1000 MG tablet Take 1,000 mg by mouth 2 (two) times daily with a meal.   Yes Historical Provider, MD  naproxen sodium (ALEVE) 220 MG tablet Take 440 mg by mouth 2 (two) times daily as needed (pain).   Yes Historical Provider, MD  Omega-3 Fatty Acids (FISH OIL) 1000 MG CAPS Take 1,000 mg by mouth 2 (two) times daily.   Yes Historical Provider, MD    Family History History reviewed. No pertinent family history.  Social History Social History  Substance Use Topics  . Smoking status: Current Every Day Smoker  . Smokeless tobacco: Not on file  . Alcohol use Yes     Allergies   Celestone [betamethasone] and Prednisone   Review of Systems Review of Systems  Gastrointestinal: Positive for diarrhea ( chronic).  Neurological: Positive for numbness.  All other systems reviewed and are negative.    Physical Exam Updated Vital Signs BP 149/83   Pulse 98   Temp 99.3 F (37.4 C) (Oral)   Resp 14   Ht 5\' 9"  (1.753 m)   Wt 124 kg   SpO2 100%   BMI 40.36 kg/m   Physical Exam  Constitutional: He is oriented to person, place, and time. He appears well-developed and well-nourished. No distress.  HENT:  Head: Normocephalic and atraumatic.  Mouth/Throat: Oropharynx is clear and moist.  Eyes: Conjunctivae and EOM are normal. Pupils are equal, round, and reactive to light. No scleral icterus.  No horizontal, vertical or rotational nystagmus  Neck: Normal range of motion. Neck supple.  Full active and passive ROM without pain No midline or paraspinal tenderness No nuchal rigidity or meningeal signs  Cardiovascular: Regular rhythm and intact distal pulses.  Tachycardia present.   Pulses:      Radial pulses are 2+ on the right side, and 2+ on the left side.       Dorsalis pedis pulses are 2+ on the  right side, and 2+ on the left side.  Pulmonary/Chest: Effort normal and breath sounds normal. No respiratory distress. He has no wheezes. He has no rales.  Abdominal: Soft. Bowel sounds are normal. There is no tenderness. There is no rebound and no guarding.  Musculoskeletal: Normal range of motion.  Right upper extremity: Full range of motion of shoulder, elbow, wrist and hand; no tenderness to palpation; no joint swelling or erythema  Left upper extremity: Full range of motion of shoulder, elbow, wrist and hand; no tenderness to palpation; no joint swelling or erythema    Lymphadenopathy:    He has no cervical adenopathy.  Neurological: He is alert and oriented to person, place, and time. No cranial nerve deficit. He exhibits normal muscle tone. Coordination normal.  Reflex Scores:      Bicep reflexes are 2+ on the right side and 2+ on the left side. Mental Status:  Alert, oriented, thought content appropriate. Speech fluent without evidence of aphasia. Able to follow 2 step commands without difficulty.  Cranial Nerves:  II:  Peripheral visual fields grossly normal, pupils equal, round, reactive to light III,IV, VI: ptosis not present, extra-ocular motions intact bilaterally  V,VII: smile symmetric, facial light touch sensation equal VIII: hearing grossly normal bilaterally  IX,X: midline uvula rise  XI: bilateral shoulder shrug equal and strong XII: midline tongue extension  Motor:  5/5 in upper and lower extremities bilaterally including strong and equal grip strength and dorsiflexion/plantar flexion Sensory: light touch decreased in fingers, hands and forearms but intact in all other regions of upper and lower extremities.  Cerebellar: normal finger-to-nose with bilateral upper extremities CV: distal pulses palpable throughout  No clonus  Skin: Skin is warm and dry. No rash noted. He is not diaphoretic.  Psychiatric: He has a normal mood and affect. His behavior is normal.  Judgment and thought content normal.  Nursing note and vitals reviewed.    ED Treatments / Results  Labs (all labs ordered are listed, but only abnormal results are displayed) Labs Reviewed  COMPREHENSIVE METABOLIC PANEL - Abnormal; Notable for the following:       Result Value   Glucose, Bld 308 (*)    AST 45 (*)    ALT 81 (*)    Alkaline Phosphatase 135 (*)    All other components within normal limits  CBC WITH DIFFERENTIAL/PLATELET - Abnormal; Notable for the following:    WBC 14.0 (*)    Neutro Abs 8.9 (*)    Lymphs Abs 4.1 (*)    All other components within normal limits  URINALYSIS, ROUTINE W REFLEX MICROSCOPIC (NOT AT [facility]) - Abnormal; Notable for the following:    Specific Gravity, Urine 1.036 (*)    Glucose, UA >1000 (*)    All other components within normal limits  URINE RAPID DRUG SCREEN, HOSP PERFORMED - Abnormal; Notable for the following:    Tetrahydrocannabinol POSITIVE (*)    All  other components within normal limits  URINE MICROSCOPIC-ADD ON - Abnormal; Notable for the following:    Squamous Epithelial / LPF 0-5 (*)    Bacteria, UA RARE (*)    All other components within normal limits  CBG MONITORING, ED - Abnormal; Notable for the following:    Glucose-Capillary 304 (*)    All other components within normal limits  I-STAT CG4 LACTIC ACID, ED - Abnormal; Notable for the following:    Lactic Acid, Venous 2.14 (*)    All other components within normal limits  CBG MONITORING, ED - Abnormal; Notable for the following:    Glucose-Capillary 233 (*)    All other components within normal limits  I-STAT CG4 LACTIC ACID, ED    Radiology Mr Laqueta Jean Or Wo Contrast  Result Date: 09/11/2015 CLINICAL DATA:  Ascending bilateral hand numbness for 2 weeks. Feeling unwell with mild headache this morning. EXAM: MRI HEAD WITHOUT AND WITH CONTRAST MRI CERVICAL SPINE WITHOUT AND WITH CONTRAST TECHNIQUE: Multiplanar, multiecho pulse sequences of the brain and surrounding  structures, and cervical spine, to include the craniocervical junction and cervicothoracic junction, were obtained without and with intravenous contrast. CONTRAST:  20 cc MultiHance COMPARISON:  None. FINDINGS: Multiple sequences are mildly or moderately motion degraded. MRI HEAD FINDINGS INTRACRANIAL CONTENTS: No reduced diffusion to suggest acute ischemia. No susceptibility artifact to suggest hemorrhage. The ventricles and sulci are normal for patient's age. 4 mm nonenhancing T2 hyperintensity RIGHT periatrial white matter suggesting perivascular space or possibly neuroglial cyst. No suspicious parenchymal signal, mass lesions, mass effect. No abnormal intraparenchymal or extra-axial enhancement. No abnormal extra-axial fluid collections. No extra-axial masses. Normal major intracranial vascular flow voids present at skull base. ORBITS: The included ocular globes and orbital contents are non-suspicious. SINUSES: The mastoid air-cells and included paranasal sinuses are well-aerated. SKULL/SOFT TISSUES: No abnormal sellar expansion. No suspicious calvarial bone marrow signal. Craniocervical junction maintained. MRI CERVICAL SPINE FINDINGS ALIGNMENT: Straightened cervical lordosis.  No malalignment. VERTEBRAE/DISCS: Vertebral bodies are intact. Intervertebral disc morphology's and signal are normal. No abnormal osseous or intradiscal enhancement. Mild congenital canal narrowing on the basis of foreshortened pedicles. CORD:Cervical spinal cord is normal morphology and signal characteristics from the cervicomedullary junction to level of T1-2, the most caudal well visualized level. POSTERIOR FOSSA, VERTEBRAL ARTERIES, PARASPINAL TISSUES: No MR findings of ligamentous injury. Vertebral artery flow voids present. Included posterior fossa paraspinal soft tissues are normal. DISC LEVELS: C2-3: No disc bulge, canal stenosis nor neural foraminal narrowing. C3-4: Tiny LEFT central to subarticular disc protrusion. No canal  stenosis. Mild LEFT neural foraminal narrowing. C4-5: Tiny central disc protrusion. Mild canal stenosis. No neural foraminal narrowing. C5-6 through C7-T1: No disc bulge, canal stenosis nor neural foraminal narrowing. IMPRESSION: MRI HEAD: Motion degraded examination. Negative contrast-enhanced MRI head. MRI CERVICAL SPINE: Motion degraded examination. Tiny C3-4 and C4-5 disc protrusions. Mild canal stenosis C4-5, no neural foraminal narrowing. No abnormal enhancement. Electronically Signed   By: Awilda Metro M.D.   On: 09/11/2015 02:47   Mr Cervical Spine W Or Wo Contrast  Result Date: 09/11/2015 CLINICAL DATA:  Ascending bilateral hand numbness for 2 weeks. Feeling unwell with mild headache this morning. EXAM: MRI HEAD WITHOUT AND WITH CONTRAST MRI CERVICAL SPINE WITHOUT AND WITH CONTRAST TECHNIQUE: Multiplanar, multiecho pulse sequences of the brain and surrounding structures, and cervical spine, to include the craniocervical junction and cervicothoracic junction, were obtained without and with intravenous contrast. CONTRAST:  20 cc MultiHance COMPARISON:  None. FINDINGS: Multiple sequences are mildly  or moderately motion degraded. MRI HEAD FINDINGS INTRACRANIAL CONTENTS: No reduced diffusion to suggest acute ischemia. No susceptibility artifact to suggest hemorrhage. The ventricles and sulci are normal for patient's age. 4 mm nonenhancing T2 hyperintensity RIGHT periatrial white matter suggesting perivascular space or possibly neuroglial cyst. No suspicious parenchymal signal, mass lesions, mass effect. No abnormal intraparenchymal or extra-axial enhancement. No abnormal extra-axial fluid collections. No extra-axial masses. Normal major intracranial vascular flow voids present at skull base. ORBITS: The included ocular globes and orbital contents are non-suspicious. SINUSES: The mastoid air-cells and included paranasal sinuses are well-aerated. SKULL/SOFT TISSUES: No abnormal sellar expansion. No  suspicious calvarial bone marrow signal. Craniocervical junction maintained. MRI CERVICAL SPINE FINDINGS ALIGNMENT: Straightened cervical lordosis.  No malalignment. VERTEBRAE/DISCS: Vertebral bodies are intact. Intervertebral disc morphology's and signal are normal. No abnormal osseous or intradiscal enhancement. Mild congenital canal narrowing on the basis of foreshortened pedicles. CORD:Cervical spinal cord is normal morphology and signal characteristics from the cervicomedullary junction to level of T1-2, the most caudal well visualized level. POSTERIOR FOSSA, VERTEBRAL ARTERIES, PARASPINAL TISSUES: No MR findings of ligamentous injury. Vertebral artery flow voids present. Included posterior fossa paraspinal soft tissues are normal. DISC LEVELS: C2-3: No disc bulge, canal stenosis nor neural foraminal narrowing. C3-4: Tiny LEFT central to subarticular disc protrusion. No canal stenosis. Mild LEFT neural foraminal narrowing. C4-5: Tiny central disc protrusion. Mild canal stenosis. No neural foraminal narrowing. C5-6 through C7-T1: No disc bulge, canal stenosis nor neural foraminal narrowing. IMPRESSION: MRI HEAD: Motion degraded examination. Negative contrast-enhanced MRI head. MRI CERVICAL SPINE: Motion degraded examination. Tiny C3-4 and C4-5 disc protrusions. Mild canal stenosis C4-5, no neural foraminal narrowing. No abnormal enhancement. Electronically Signed   By: Awilda Metro M.D.   On: 09/11/2015 02:47    Procedures Procedures (including critical care time)  Medications Ordered in ED Medications  sodium chloride 0.9 % bolus 1,000 mL (0 mLs Intravenous Stopped 09/11/15 0043)  LORazepam (ATIVAN) injection 1 mg (1 mg Intravenous Given 09/11/15 0042)  sodium chloride 0.9 % bolus 1,000 mL (0 mLs Intravenous Stopped 09/11/15 0447)  gadobenate dimeglumine (MULTIHANCE) injection 20 mL (20 mLs Intravenous Contrast Given 09/11/15 0336)  ibuprofen (ADVIL,MOTRIN) tablet 800 mg (800 mg Oral Given 09/11/15 0516)      Initial Impression / Assessment and Plan / ED Course  I have reviewed the triage vital signs and the nursing notes.  Pertinent labs & imaging results that were available during my care of the patient were reviewed by me and considered in my medical decision making (see chart for details).  Clinical Course  Value Comment By Time  Lactic Acid, Venous: (!!) 2.14 elevated Dierdre Forth, PA-C 08/04 2343  Tetrahydrocannabinol: (!) POSITIVE UDS + Dierdre Forth, PA-C 08/04 2343  Glucose: (!) 308 Elevated, no elevated AG Dierdre Forth, PA-C 08/04 2343  Ketones, ur: NEGATIVE No keytones or UTI Dierdre Forth, PA-C 08/04 2343  Lactic Acid, Venous: 1.12 Improving lactic acid Dierdre Forth, PA-C 08/05 0059  Glucose-Capillary: (!) 233 Improved after fluids Kesha Hurrell, PA-C 08/05 0340  BP: 136/81 BP improved after fluids; tachycardia resolved. Dahlia Client Kimani Bedoya, PA-C 08/05 0340   Pt with bilateral hand paresthesias.  No weakness in the upper extremities.  No ssx in the lower extremities.  No SOB or fatigue.  Highly doubt a jamboree at this time.  Pt with family history of MS. MRI of the brain and spine are without white matter disease, evidence of transverse myelitis, mass or other abnormal findings. Cervical spine MRI shows mild  disc protrusion at C3-C5, but this is tiny and not likely the cause of patient's symptoms. He does not have primary care physician or insurance. I have recommended close follow-up with the wellness center and referral to neurology.  Discussed reasons to return immediately to the emergency department including development of weakness, progression of the numbness, shortness of breath or other concerns. Patient and significant other state understanding and are in agreement with discharge home.  Glucose has improved with fluids. Patient is to continue taking his home medications and monitor closely.    Final Clinical Impressions(s) / ED  Diagnoses   Final diagnoses:  Neuropathy (HCC)  Paresthesia  Hyperglycemia    New Prescriptions Discharge Medication List as of 09/11/2015  3:43 AM       Dierdre Forth, PA-C 09/11/15 6045    Derwood Kaplan, MD 09/11/15 0800

## 2015-09-11 NOTE — Discharge Instructions (Signed)
1. Medications: usual home medications 2. Treatment: rest, drink plenty of fluids,  3. Follow Up: Please followup with the [facility] with neurology in 3-5 days for discussion of your diagnoses and further evaluation after today's visit; if you do not have a primary care doctor use the resource guide provided to find one; Please return to the ER for progression of symptoms, shortness of breath, development of weakness or other concerns.

## 2015-09-11 NOTE — ED Notes (Signed)
Pt states he needs medication to help him relax prior to MRI

## 2015-09-11 NOTE — ED Notes (Signed)
Pt requesting pain medication for back.  States back is hurting from MRI.  Will notify PA prior to discharge.

## 2015-10-18 ENCOUNTER — Encounter: Payer: Self-pay | Admitting: Family Medicine

## 2015-10-18 ENCOUNTER — Ambulatory Visit (INDEPENDENT_AMBULATORY_CARE_PROVIDER_SITE_OTHER): Payer: Self-pay | Admitting: Family Medicine

## 2015-10-18 VITALS — BP 144/88 | HR 89 | Temp 98.1°F | Resp 16 | Ht 68.0 in | Wt 263.0 lb

## 2015-10-18 DIAGNOSIS — F419 Anxiety disorder, unspecified: Secondary | ICD-10-CM

## 2015-10-18 DIAGNOSIS — G8929 Other chronic pain: Secondary | ICD-10-CM

## 2015-10-18 DIAGNOSIS — Z23 Encounter for immunization: Secondary | ICD-10-CM

## 2015-10-18 DIAGNOSIS — M25511 Pain in right shoulder: Secondary | ICD-10-CM

## 2015-10-18 DIAGNOSIS — Z720 Tobacco use: Secondary | ICD-10-CM | POA: Insufficient documentation

## 2015-10-18 DIAGNOSIS — M62838 Other muscle spasm: Secondary | ICD-10-CM

## 2015-10-18 DIAGNOSIS — I10 Essential (primary) hypertension: Secondary | ICD-10-CM

## 2015-10-18 DIAGNOSIS — E119 Type 2 diabetes mellitus without complications: Secondary | ICD-10-CM | POA: Insufficient documentation

## 2015-10-18 LAB — LIPID PANEL
CHOL/HDL RATIO: 4.5 ratio (ref <=5.0)
Cholesterol: 159 mg/dL (ref 125–200)
HDL: 35 mg/dL — ABNORMAL LOW (ref >=40)
LDL CALC: 99 mg/dL (ref <130)
TRIGLYCERIDES: 123 mg/dL (ref <150)
VLDL: 25 mg/dL (ref <30)

## 2015-10-18 LAB — POCT GLYCOSYLATED HEMOGLOBIN (HGB A1C): Hemoglobin A1C: 7.3

## 2015-10-18 MED ORDER — CYCLOBENZAPRINE HCL 10 MG PO TABS: 5.0000 mg | ORAL_TABLET | Freq: Three times a day (TID) | ORAL | 0 refills | Status: DC | PRN

## 2015-10-18 MED ORDER — LISINOPRIL 10 MG PO TABS: 10.0000 mg | ORAL_TABLET | Freq: Every day | ORAL | 5 refills | Status: DC

## 2015-10-18 MED ORDER — SERTRALINE HCL 100 MG PO TABS: 100.0000 mg | ORAL_TABLET | Freq: Every day | ORAL | 5 refills | Status: DC

## 2015-10-18 MED ORDER — METFORMIN HCL 1000 MG PO TABS
1000.0000 mg | ORAL_TABLET | Freq: Two times a day (BID) | ORAL | 5 refills | Status: DC
Start: 2015-10-18 — End: 2023-12-21

## 2015-10-18 NOTE — Assessment & Plan Note (Signed)
BMI >39, recent worsening wt gain without trulicity. Improving lifestyle modifications

## 2015-10-18 NOTE — Assessment & Plan Note (Signed)
Elevated BP today >150/100 initially, repeat manual 144/88, improved. 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  Plan: 1. Continue Lisinopril 10mg  daily 2. Not due for labs - last 09/2015 with normal Cr electrolytes 3. Lifestyle modifications 4. Follow-up regularly in office, continue check outside office

## 2015-10-18 NOTE — Assessment & Plan Note (Signed)
Start Flexeril, conservative treatments, hand out stretches Follow-up PRN

## 2015-10-18 NOTE — Assessment & Plan Note (Signed)
Likely multifactorial secondary to prior traumas with old sports injuries. 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 No evidence of rotator cuff instability or weakness today.  Plan: 1. Start trial Flexeril 5-10mg  PRN, conservative Tylenol / Aleve PRN, ice/heat 2. Demonstrated Scapular Release myofascial technique today, may do some self technique with tennis ball 3. Handout given on shoulder stretches / exercises 4. Follow-up PRN if not improving, consider PT, if needed may consider Sports Med / Ortho

## 2015-10-18 NOTE — Progress Notes (Signed)
Subjective:    Patient ID: Douglas Jackson, male    DOB: Feb 22, 1985, 30 y.o.   MRN: [id number]  Douglas Jackson is a 30 y.o. male presenting on 10/18/2015 for Establish Care  Previously established with [facility]), last visit >1 year ago. Currently does not have insurance, waiting to establish with health insurance.  HPI   CHRONIC HTN: Reports checks BP outside office with electric wrist cuff, 120-130s/60s. Initially 150s/90-100 before takes meds after work. Current Meds - Lisinopril 10mg  - has not taken today. Tolerating well, w/o complaints. Denies CP, dyspnea, HA, edema, dizziness / lightheadedness  CHRONIC DM, Type 2: Reports initial dx DM 2-3 years ago CBGs: Does not check sugars Meds: Metformin 1000mg  twice daily. Previously on Trulicity weekly injection (off for >9 months, without insurance) Currently on ACEi Denies hypoglycemia, polyuria, visual changes, numbness or tingling.  Morbid Obesity / BMI >39 - Does not recall prior cholesterol results. Admits to recent weight gain over past 6-12 months, worse since off Trulicity. Never on cholesterol medication. Unsure family history of HLD. Fasting today and will get labs. Lifestyle: Diet (reducing high carb foods potatoes / mac&cheese, pizza, pasta, trying to eat more chicken / fish and fresh fruits, drinks mostly water, occasional sugar free gatorade, very infrequent sweet tea) / Exercise (work-out weight training at home, ride bicycle 30 min - x 2 weekly) - weight down to 245-250 on trulicity, now back up to 270s  Anxiety and Agitation / Irritability: - Reports history in high school started with some life stressors related to his mother. Had been referred to pscyh therapy and started Zoloft, titrated up to 100mg  daily and was tolerating well. Has been out for past 4 months. Requests refill now. - Admits difficulty sleeping, goes to bed 2:30pm and wakes up around 9pm, difficulty if sleeps too much, previously on a 12 hr  variable night shift schedule few months ago. Now on 8 hr night shift work schedule, complicating sleeping  Chronic Right Shoulder pain, arm numbness / Chronic RLE Sciatica - Reports chronic history of multiple injuries and traumas from years of sports, played football, wrestling, baseball. Has had history of multiple dislocated R shoulder in past. 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 Has not seen Neurologist. - Also Right sciatica with sciatic nerve injury and R piriformis injury. Right posterior leg with persistent numbness and some calf spasms.  TOBACCO ABUSE - Reports chronic history smoking >13 years, also smokeless tobacco. Has quit before. Now primarily uses vaping most days, does not smoke cigarette except q 1-3 days, < 2-3 cigarettes. Has not used smokeless tobacco >2 weeks. Health Maintenance: - Due for Flu shot today, will get. Admits to some minor sinus congestion within 1-2 days, but no fevers/chills, purulent sinus drainage, or sinus pain. - No family history of colon cancer or prostate cancer  Past Medical History:  Diagnosis Date  . Anxiety   . Diabetes mellitus without complication (HCC)   . Hypertension    Social History   Social History  . Marital status: Single    Spouse name: N/A  . Number of children: N/A  . Years of education: GED   Occupational History  . 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 Smoking status: Current Every Day Smoker    Packs/day: 0.20    Years: 10.00  . Smokeless tobacco: Former Neurosurgeon     Comment: Vapes and some smokeless tobacco  . Alcohol use 0.6 oz/week    1 Cans of beer per week  . Drug use: No  . Sexual activity: Not on  file   Other Topics Concern  . Not on file   Social History Narrative  . No narrative on file   Family History  Problem Relation Age of Onset  . COPD Mother   . Heart disease Mother   . Hypertension Mother   . Fibromyalgia Mother   . Diabetes Mother   . Drug abuse Father   . Alcohol abuse Father   . Hypertension Sister    Current Outpatient Prescriptions on File Prior to Visit  Medication Sig  . B Complex Vitamins (VITAMIN B COMPLEX) TABS Take 1 tablet by mouth 2 (two) times daily.  . naproxen sodium (ALEVE) 220 MG tablet Take 440 mg by mouth 2 (two) times daily as needed (pain).  . Omega-3 Fatty Acids (FISH OIL) 1000 MG CAPS Take 1,000 mg by mouth 2 (two) times daily.   No current facility-administered medications on file prior to visit.     Review of Systems Per HPI unless specifically indicated above     Objective:    BP (!) 144/88 (BP Location: Left Arm, Cuff Size: Normal)   Pulse 89   Temp 98.1 F (36.7 C) (Oral)   Resp 16   Ht 5\' 8"  (1.727 m)   Wt 263 lb (119.3 kg)   BMI 39.99 kg/m   Wt Readings from Last 3 Encounters:  10/18/15 263 lb (119.3 kg)  09/10/15 273 lb 4.8 oz (124 kg)  09/01/13 268 lb (121.6 kg)    Physical Exam  Constitutional: He is oriented to person, place, and time. He appears well-developed and well-nourished. No distress.  Well-appearing, comfortable, cooperative  HENT:  Head: Normocephalic and atraumatic.  Mouth/Throat: Oropharynx is clear and moist.  Non-tender frontal and maxillary sinuses. Mild nasal edema without congestion. Oropharynx clear.  Eyes: Conjunctivae and EOM are normal. Pupils are equal, round, and reactive to light.  Neck: Normal range of motion. Neck supple. No thyromegaly present.  Cardiovascular: Normal rate, regular rhythm, normal heart sounds and intact distal pulses.   No murmur heard. Pulmonary/Chest: Effort normal and breath sounds normal. No respiratory distress. He has no wheezes. He has no rales.    Abdominal: Soft. Bowel sounds are normal. He exhibits no distension and no mass. There is no tenderness.  Musculoskeletal:  Right Shoulder Inspection: Normal appearance bilateral symmetrical Palpation: Mild tender to palpation posterior lower scapula edge, otherwise non-tender palpation over anterior, AC, lateral shoulder  ROM: Full intact active ROM forward flexion, abduction, internal / external rotation, symmetrical Special Testing: Rotator cuff testing negative for weakness with supraspinatus full can and empty can test, O'brien's negative for labral pain. Mild positive AC impingement for pain. Strength: Normal strength 5/5 flex/ext, ext rot / int rot, grip, rotator cuff str testing. Neurovascular: Distally intact pulses. Mild reduced sensation light touch over 4th/5th digits Right hand in ulnar distribution.  Lymphadenopathy:    He has no cervical adenopathy.  Neurological: He is alert and oriented to person, place, and time.  Skin: Skin is warm and dry. No rash noted. He is not diaphoretic.  Psychiatric: He has a normal mood and affect. His behavior  is normal. Thought content normal.  Nursing note and vitals reviewed.  Results for orders placed or performed in visit on 10/18/15  POCT glycosylated hemoglobin (Hb A1C)  Result Value Ref Range   Hemoglobin A1C 7.3       Assessment & Plan:   Problem List Items Addressed This Visit    Tobacco abuse    Recent reduced smoking, inc vaping. Limited use smokeless tobacco. Chronic tobacco abuse. Not ready to quit. Brief smoking cessation counseling provided, follow-up when ready      Muscle spasm of right shoulder    Start Flexeril, conservative treatments, hand out stretches Follow-up PRN      Relevant Medications   cyclobenzaprine (FLEXERIL) 10 MG tablet   Morbid obesity (HCC)    BMI >39, recent worsening wt gain without trulicity. Improving lifestyle modifications      Relevant Medications   metFORMIN (GLUCOPHAGE) 1000 MG  tablet   Other Relevant Orders   Lipid panel   Hypertension    Elevated BP today >150/100 initially, repeat manual 144/88, improved. 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  Plan: 1. Continue Lisinopril 10mg  daily 2. Not due for labs - last 09/2015 with normal Cr electrolytes 3. Lifestyle modifications 4. Follow-up regularly in office, continue check outside office      Relevant Medications   lisinopril (PRINIVIL,ZESTRIL) 10 MG tablet   Diabetes mellitus without complication (HCC) - Primary    Well controlled. A1c today 7.3, unsure last for comparison No complications or hypoglycemia.  Plan:  1. Refill Metformin 1000mg  BID 2. Future plan to resume Trulicity for DM / weight, was discontinued >9 mo ago due to lack of ins 3. Educated on DM diet / lifestyle, inc aerobic exercise 4. Check fasting lipids - consider future statin depending on ASCVD risk 5. Future Annual Eye Exam 6. Follow-up q 3-6 mo for A1c monitoring, space out if controlled. Due for PNA vaccine, DM foot exam, Ophthal > will accomplish at next dedicated DM follow-up visit        Relevant Medications   lisinopril (PRINIVIL,ZESTRIL) 10 MG tablet   metFORMIN (GLUCOPHAGE) 1000 MG tablet   Other Relevant Orders   POCT glycosylated hemoglobin (Hb A1C) (Completed)   Lipid panel   Chronic right shoulder pain    Likely multifactorial secondary to prior traumas with old sports injuries. 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 No evidence of rotator cuff instability or weakness today.  Plan: 1. Start trial Flexeril 5-10mg  PRN, conservative Tylenol / Aleve PRN, ice/heat 2. Demonstrated Scapular Release myofascial technique today, may do some self technique with tennis ball 3. Handout given on shoulder stretches / exercises 4. Follow-up PRN if not improving, consider PT, if needed may consider Sports Med / Ortho      Relevant  Medications   cyclobenzaprine (FLEXERIL) 10 MG tablet   Anxiety    Previously controlled Anxiety / agitation without depressive symptoms, recent worsening off Zoloft for months due to cost/lack ins - Re-order Zoloft 100mg  daily, patient instructed to start half tabs 50mg  daily for 1-2 weeks, if needed go back to whole tab daily      Relevant Medications   sertraline (ZOLOFT) 100 MG tablet    Other Visit Diagnoses    Needs flu shot       Relevant Orders   Flu Vaccine QUAD 36+ mos IM (Completed)      Meds ordered this encounter  Medications  .  sertraline (ZOLOFT) 100 MG tablet    Sig: Take 1 tablet (100 mg total) by mouth daily.    Dispense:  30 tablet    Refill:  5  . cyclobenzaprine (FLEXERIL) 10 MG tablet    Sig: Take 0.5-1 tablets (5-10 mg total) by mouth 3 (three) times daily as needed for muscle spasms.    Dispense:  15 tablet    Refill:  0  . lisinopril (PRINIVIL,ZESTRIL) 10 MG tablet    Sig: Take 1 tablet (10 mg total) by mouth daily.    Dispense:  30 tablet    Refill:  5  . metFORMIN (GLUCOPHAGE) 1000 MG tablet    Sig: Take 1 tablet (1,000 mg total) by mouth 2 (two) times daily with a meal.    Dispense:  60 tablet    Refill:  5      Follow up plan: Return in about 3 months (around 01/17/2016) for diabetes, blood pressure.  Saralyn PilarAlexander Karamalegos, DO [facility] 10/18/2015, 12:26 PM

## 2015-10-18 NOTE — Assessment & Plan Note (Addendum)
Well controlled. A1c today 7.3, unsure last for comparison No complications or hypoglycemia.  Plan:  1. Refill Metformin 1000mg  BID 2. Future plan to resume Trulicity for DM / weight, was discontinued >9 mo ago due to lack of ins 3. Educated on DM diet / lifestyle, inc aerobic exercise 4. Check fasting lipids - consider future statin depending on ASCVD risk 5. Future Annual Eye Exam 6. Follow-up q 3-6 mo for A1c monitoring, space out if controlled. Due for PNA vaccine, DM foot exam, Ophthal > will accomplish at next dedicated DM follow-up visit

## 2015-10-18 NOTE — Assessment & Plan Note (Signed)
Previously controlled Anxiety / agitation without depressive symptoms, recent worsening off Zoloft for months due to cost/lack ins - Re-order Zoloft 100mg  daily, patient instructed to start half tabs 50mg  daily for 1-2 weeks, if needed go back to whole tab daily

## 2015-10-18 NOTE — Patient Instructions (Signed)
Thank you for coming in to clinic today.  1. For Diabetes - checked A1c  2. Continue Metformin for now, once you get your health insurance, please let me know and we will resume the Trulicity. Keep up good work with your improved Diabetic diet and work on increasing regular aerobic exercise  3. We will watch Blood Pressure, in future if remains uncontrolled may consider adding a 2nd agent. Nicotine and smoking will increase your blood pressure, consider keep trying to cut back and quit smoking!  4. Refilled Zoloft for Anxiety - given 100mg  tablets - Start by cutting each dose in half, take half of a 100mg  tablet or 50mg  daily for next 1-2 weeks, if tolerating well, but need to increase back to 100mg  to feel better, then go ahead and take 1 whole tablet daily  5. For Right shoulder problems, likely some underlying arthritis / arthropathy, along with sold old rotator cuff or nerve injury. Your shoulder seems to be generally intact and no significant weakness on my exam. Will start with Flexeril  If pain is worsening may try Naproxen or Aleve OTC  Recommend trial of Anti-inflammatory with Naproxen / Aleve 220mg  tabs - take one with food and plenty of water TWICE daily every day (breakfast and dinner), for next 2 to 4 weeks, then you may take only as needed - DO NOT TAKE any ibuprofen, motrin while you are taking this medicine - It is safe to take Tylenol Ext Str 500mg  tabs - take 1 to 2 (max dose 1000mg ) every 6 hours as needed for breakthrough pain, max 24 hour daily dose is 6 to 8 tablets or 4000mg   Start Cyclobenzapine (Flexeril) 10mg  tablets (muscle relaxant) - cut in half for 5mg  at night for muscle relaxant - may make you sedated or sleepy (be careful driving or working on this) if tolerated you can take half to whole tab 2 to 3 times daily or every 8 hours as needed  Return for a flu shot later this season - self pay is over $20  Generic Shoulder Exercises EXERCISES  RANGE OF MOTION (ROM)  AND STRETCHING EXERCISES These exercises may help you when beginning to rehabilitate your injury. Your symptoms may resolve with or without further involvement from your physician, physical therapist or athletic trainer. While completing these exercises, remember:   Restoring tissue flexibility helps normal motion to return to the joints. This allows healthier, less painful movement and activity.  An effective stretch should be held for at least 30 seconds.  A stretch should never be painful. You should only feel a gentle lengthening or release in the stretched tissue. ROM - Pendulum  Bend at the waist so that your right / left arm falls away from your body. Support yourself with your opposite hand on a solid surface, such as a table or a countertop.  Your right / left arm should be perpendicular to the ground. If it is not perpendicular, you need to lean over farther. Relax the muscles in your right / left arm and shoulder as much as possible.  Gently sway your hips and trunk so they move your right / left arm without any use of your right / left shoulder muscles.  Progress your movements so that your right / left arm moves side to side, then forward and backward, and finally, both clockwise and counterclockwise.  Complete __________ repetitions in each direction. Many people use this exercise to relieve discomfort in their shoulder as well as to gain range  of motion. Repeat __________ times. Complete this exercise __________ times per day. STRETCH - Flexion, Standing  Stand with good posture. With an underhand grip on your right / left hand and an overhand grip on the opposite hand, grasp a broomstick or cane so that your hands are a little more than shoulder-width apart.  Keeping your right / left elbow straight and shoulder muscles relaxed, push the stick with your opposite hand to raise your right / left arm in front of your body and then overhead. Raise your arm until you feel a stretch  in your right / left shoulder, but before you have increased shoulder pain.  Try to avoid shrugging your right / left shoulder as your arm rises by keeping your shoulder blade tucked down and toward your mid-back spine. Hold __________ seconds.  Slowly return to the starting position. Repeat __________ times. Complete this exercise __________ times per day. STRETCH - Internal Rotation  Place your right / left hand behind your back, palm-up.  Throw a towel or belt over your opposite shoulder. Grasp the towel/belt with your right / left hand.  While keeping an upright posture, gently pull up on the towel/belt until you feel a stretch in the front of your right / left shoulder.  Avoid shrugging your right / left shoulder as your arm rises by keeping your shoulder blade tucked down and toward your mid-back spine.  Hold __________. Release the stretch by lowering your opposite hand. Repeat __________ times. Complete this exercise __________ times per day. STRETCH - External Rotation and Abduction  Stagger your stance through a doorframe. It does not matter which foot is forward.  As instructed by your physician, physical therapist or athletic trainer, place your hands:  And forearms above your head and on the door frame.  And forearms at head-height and on the door frame.  At elbow-height and on the door frame.  Keeping your head and chest upright and your stomach muscles tight to prevent over-extending your low-back, slowly shift your weight onto your front foot until you feel a stretch across your chest and/or in the front of your shoulders.  Hold __________ seconds. Shift your weight to your back foot to release the stretch. Repeat __________ times. Complete this stretch __________ times per day.  STRENGTHENING EXERCISES  These exercises may help you when beginning to rehabilitate your injury. They may resolve your symptoms with or without further involvement from your physician,  physical therapist or athletic trainer. While completing these exercises, remember:   Muscles can gain both the endurance and the strength needed for everyday activities through controlled exercises.  Complete these exercises as instructed by your physician, physical therapist or athletic trainer. Progress the resistance and repetitions only as guided.  You may experience muscle soreness or fatigue, but the pain or discomfort you are trying to eliminate should never worsen during these exercises. If this pain does worsen, stop and make certain you are following the directions exactly. If the pain is still present after adjustments, discontinue the exercise until you can discuss the trouble with your clinician.  If advised by your physician, during your recovery, avoid activity or exercises which involve actions that place your right / left hand or elbow above your head or behind your back or head. These positions stress the tissues which are trying to heal. STRENGTH - Scapular Depression and Adduction  With good posture, sit on a firm chair. Supported your arms in front of you with pillows, arm rests or  a table top. Have your elbows in line with the sides of your body.  Gently draw your shoulder blades down and toward your mid-back spine. Gradually increase the tension without tensing the muscles along the top of your shoulders and the back of your neck.  Hold for __________ seconds. Slowly release the tension and relax your muscles completely before completing the next repetition.  After you have practiced this exercise, remove the arm support and complete it in standing as well as sitting. Repeat __________ times. Complete this exercise __________ times per day.  STRENGTH - External Rotators  Secure a rubber exercise band/tubing to a fixed object so that it is at the same height as your right / left elbow when you are standing or sitting on a firm surface.  Stand or sit so that the secured  exercise band/tubing is at your side that is not injured.  Bend your elbow 90 degrees. Place a folded towel or small pillow under your right / left arm so that your elbow is a few inches away from your side.  Keeping the tension on the exercise band/tubing, pull it away from your body, as if pivoting on your elbow. Be sure to keep your body steady so that the movement is only coming from your shoulder rotating.  Hold __________ seconds. Release the tension in a controlled manner as you return to the starting position. Repeat __________ times. Complete this exercise __________ times per day.  STRENGTH - Supraspinatus  Stand or sit with good posture. Grasp a __________ weight or an exercise band/tubing so that your hand is "thumbs-up," like when you shake hands.  Slowly lift your right / left hand from your thigh into the air, traveling about 30 degrees from straight out at your side. Lift your hand to shoulder height or as far as you can without increasing any shoulder pain. Initially, many people do not lift their hands above shoulder height.  Avoid shrugging your right / left shoulder as your arm rises by keeping your shoulder blade tucked down and toward your mid-back spine.  Hold for __________ seconds. Control the descent of your hand as you slowly return to your starting position. Repeat __________ times. Complete this exercise __________ times per day.  STRENGTH - Shoulder Extensors  Secure a rubber exercise band/tubing so that it is at the height of your shoulders when you are either standing or sitting on a firm arm-less chair.  With a thumbs-up grip, grasp an end of the band/tubing in each hand. Straighten your elbows and lift your hands straight in front of you at shoulder height. Step back away from the secured end of band/tubing until it becomes tense.  Squeezing your shoulder blades together, pull your hands down to the sides of your thighs. Do not allow your hands to go behind  you.  Hold for __________ seconds. Slowly ease the tension on the band/tubing as you reverse the directions and return to the starting position. Repeat __________ times. Complete this exercise __________ times per day.  STRENGTH - Scapular Retractors  Secure a rubber exercise band/tubing so that it is at the height of your shoulders when you are either standing or sitting on a firm arm-less chair.  With a palm-down grip, grasp an end of the band/tubing in each hand. Straighten your elbows and lift your hands straight in front of you at shoulder height. Step back away from the secured end of band/tubing until it becomes tense.  Squeezing your shoulder blades together,  draw your elbows back as you bend them. Keep your upper arm lifted away from your body throughout the exercise.  Hold __________ seconds. Slowly ease the tension on the band/tubing as you reverse the directions and return to the starting position. Repeat __________ times. Complete this exercise __________ times per day. STRENGTH - Scapular Depressors  Find a sturdy chair without wheels, such as a from a dining room table.  Keeping your feet on the floor, lift your bottom from the seat and lock your elbows.  Keeping your elbows straight, allow gravity to pull your body weight down. Your shoulders will rise toward your ears.  Raise your body against gravity by drawing your shoulder blades down your back, shortening the distance between your shoulders and ears. Although your feet should always maintain contact with the floor, your feet should progressively support less body weight as you get stronger.  Hold __________ seconds. In a controlled and slow manner, lower your body weight to begin the next repetition. Repeat __________ times. Complete this exercise __________ times per day.    This information is not intended to replace advice given to you by your health care provider. Make sure you discuss any questions you have with  your health care provider.   Document Released: 12/07/2004 Document Revised: 02/13/2014 Document Reviewed: 05/07/2008 Elsevier Interactive Patient Education 2016 [employer]   Please schedule a follow-up appointment with Dr. Althea CharonKaramalegos in 3 to 6 months to follow-up Diabetes / BP / Anxiety  If you have any other questions or concerns, please feel free to call the clinic or send a message through MyChart. You may also schedule an earlier appointment if necessary.  Saralyn PilarAlexander Jennalyn Cawley, DO [facility]

## 2015-10-18 NOTE — Assessment & Plan Note (Signed)
Recent reduced smoking, inc vaping. Limited use smokeless tobacco. Chronic tobacco abuse. Not ready to quit. Brief smoking cessation counseling provided, follow-up when ready

## 2015-11-15 ENCOUNTER — Ambulatory Visit: Payer: Self-pay | Admitting: Neurology

## 2016-01-17 ENCOUNTER — Ambulatory Visit: Payer: Self-pay | Admitting: Family Medicine

## 2016-02-02 ENCOUNTER — Emergency Department (HOSPITAL_COMMUNITY): Payer: BLUE CROSS/BLUE SHIELD

## 2016-02-02 ENCOUNTER — Emergency Department (HOSPITAL_COMMUNITY)
Admission: EM | Admit: 2016-02-02 | Discharge: 2016-02-02 | Disposition: A | Discharge status: Home / Self Care | Payer: BLUE CROSS/BLUE SHIELD | Attending: Emergency Medicine | Admitting: Emergency Medicine

## 2016-02-02 ENCOUNTER — Encounter (HOSPITAL_COMMUNITY): Payer: Self-pay | Admitting: Emergency Medicine

## 2016-02-02 DIAGNOSIS — Y999 Unspecified external cause status: Secondary | ICD-10-CM | POA: Insufficient documentation

## 2016-02-02 DIAGNOSIS — E119 Type 2 diabetes mellitus without complications: Secondary | ICD-10-CM | POA: Diagnosis not present

## 2016-02-02 DIAGNOSIS — F17200 Nicotine dependence, unspecified, uncomplicated: Secondary | ICD-10-CM | POA: Insufficient documentation

## 2016-02-02 DIAGNOSIS — Z7984 Long term (current) use of oral hypoglycemic drugs: Secondary | ICD-10-CM | POA: Diagnosis not present

## 2016-02-02 DIAGNOSIS — S6991XA Unspecified injury of right wrist, hand and finger(s), initial encounter: Secondary | ICD-10-CM | POA: Diagnosis present

## 2016-02-02 DIAGNOSIS — Y939 Activity, unspecified: Secondary | ICD-10-CM | POA: Diagnosis not present

## 2016-02-02 DIAGNOSIS — Y929 Unspecified place or not applicable: Secondary | ICD-10-CM | POA: Diagnosis not present

## 2016-02-02 DIAGNOSIS — I10 Essential (primary) hypertension: Secondary | ICD-10-CM | POA: Insufficient documentation

## 2016-02-02 DIAGNOSIS — S6391XA Sprain of unspecified part of right wrist and hand, initial encounter: Secondary | ICD-10-CM | POA: Diagnosis not present

## 2016-02-02 MED ORDER — HYDROCODONE-ACETAMINOPHEN 5-325 MG PO TABS
1.0000 | ORAL_TABLET | Freq: Once | ORAL | Status: AC
  Administered 2016-02-02: 1 via ORAL
  Filled 2016-02-02: qty 1

## 2016-02-02 MED ORDER — NAPROXEN 375 MG PO TABS: 375.0000 mg | ORAL_TABLET | Freq: Two times a day (BID) | ORAL | 0 refills | Status: DC
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
## 2016-02-02 NOTE — Discharge Instructions (Signed)
Your x-ray was negative for any fracture. Please wear the wrist splint as needed for comfort. He may take naproxen as needed for pain. Do not take any NSAIDs on top of this. He may also take Tylenol. Please rest, ice, elevate your right hand. I getting a referral to the orthopedic to follow-up if her symptoms not improve. Follow up with her primary care doctor in regards to today's visit. He also could follow up with your primary care doctor to have your blood pressure recheck. Return to the ED if your symptoms worsen.
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
## 2016-02-28 ENCOUNTER — Encounter: Payer: Self-pay | Admitting: Emergency Medicine

## 2016-02-28 DIAGNOSIS — R05 Cough: Secondary | ICD-10-CM | POA: Diagnosis not present

## 2016-02-28 DIAGNOSIS — R0981 Nasal congestion: Secondary | ICD-10-CM | POA: Insufficient documentation

## 2016-02-28 DIAGNOSIS — R51 Headache: Secondary | ICD-10-CM | POA: Insufficient documentation

## 2016-02-28 DIAGNOSIS — Z7984 Long term (current) use of oral hypoglycemic drugs: Secondary | ICD-10-CM | POA: Diagnosis not present

## 2016-02-28 DIAGNOSIS — E119 Type 2 diabetes mellitus without complications: Secondary | ICD-10-CM | POA: Insufficient documentation

## 2016-02-28 DIAGNOSIS — I10 Essential (primary) hypertension: Secondary | ICD-10-CM | POA: Insufficient documentation

## 2016-02-28 DIAGNOSIS — Z79899 Other long term (current) drug therapy: Secondary | ICD-10-CM | POA: Insufficient documentation

## 2016-02-28 DIAGNOSIS — Z5321 Procedure and treatment not carried out due to patient leaving prior to being seen by health care provider: Secondary | ICD-10-CM | POA: Insufficient documentation

## 2016-02-28 DIAGNOSIS — F17200 Nicotine dependence, unspecified, uncomplicated: Secondary | ICD-10-CM | POA: Diagnosis not present

## 2016-02-28 NOTE — ED Triage Notes (Addendum)
Patient ambulatory to triage with steady gait, without difficulty or distress noted, mask in place; pt reports today having cough, sinus congestion, HA/sinus pressure

## 2016-02-29 ENCOUNTER — Emergency Department
Admission: EM | Admit: 2016-02-29 | Discharge: 2016-02-29 | Disposition: A | Discharge status: Home / Self Care | Payer: BLUE CROSS/BLUE SHIELD | Attending: Emergency Medicine | Admitting: Emergency Medicine

## 2016-05-27 ENCOUNTER — Encounter (HOSPITAL_COMMUNITY): Payer: Self-pay | Admitting: *Deleted

## 2016-05-27 ENCOUNTER — Emergency Department (HOSPITAL_COMMUNITY)
Admission: EM | Admit: 2016-05-27 | Discharge: 2016-05-27 | Disposition: A | Discharge status: Home / Self Care | Payer: BLUE CROSS/BLUE SHIELD | Attending: Emergency Medicine | Admitting: Emergency Medicine

## 2016-05-27 DIAGNOSIS — G43901 Migraine, unspecified, not intractable, with status migrainosus: Secondary | ICD-10-CM | POA: Diagnosis not present

## 2016-05-27 DIAGNOSIS — I10 Essential (primary) hypertension: Secondary | ICD-10-CM | POA: Insufficient documentation

## 2016-05-27 DIAGNOSIS — Z79899 Other long term (current) drug therapy: Secondary | ICD-10-CM | POA: Insufficient documentation

## 2016-05-27 DIAGNOSIS — E119 Type 2 diabetes mellitus without complications: Secondary | ICD-10-CM | POA: Insufficient documentation

## 2016-05-27 DIAGNOSIS — F17200 Nicotine dependence, unspecified, uncomplicated: Secondary | ICD-10-CM | POA: Diagnosis not present

## 2016-05-27 DIAGNOSIS — Z7984 Long term (current) use of oral hypoglycemic drugs: Secondary | ICD-10-CM | POA: Insufficient documentation

## 2016-05-27 DIAGNOSIS — R51 Headache: Secondary | ICD-10-CM | POA: Diagnosis present

## 2016-05-27 MED ORDER — KETOROLAC TROMETHAMINE 30 MG/ML IJ SOLN
30.0000 mg | Freq: Once | INTRAMUSCULAR | Status: AC
  Administered 2016-05-27: 30 mg via INTRAMUSCULAR
  Filled 2016-05-27: qty 1

## 2016-05-27 MED ORDER — METOCLOPRAMIDE HCL 10 MG PO TABS
10.0000 mg | ORAL_TABLET | Freq: Once | ORAL | Status: AC
Start: 2016-05-27 — End: 2016-05-27
  Administered 2016-05-27: 10 mg via ORAL
  Filled 2016-05-27: qty 1

## 2016-05-27 MED ORDER — DIPHENHYDRAMINE HCL 25 MG PO CAPS
25.0000 mg | ORAL_CAPSULE | Freq: Once | ORAL | Status: AC
  Administered 2016-05-27: 25 mg via ORAL
  Filled 2016-05-27: qty 1

## 2016-05-27 NOTE — Discharge Instructions (Signed)
Please read attached information. If you experience any new or worsening signs or symptoms please return to the emergency room for evaluation. Please follow-up with your primary care provider or specialist as discussed. Please use medication prescribed only as directed and discontinue taking if you have any concerning signs or symptoms.   °

## 2016-05-27 NOTE — ED Provider Notes (Signed)
[facility] DEPT Provider Note   CSN: [id number] Arrival date & time: 05/27/16  1109     History   Chief Complaint Chief Complaint  Patient presents with  . Headache    HPI Douglas Jackson is a 31 y.o. male.  HPI   31 year old male presents today with complaints of migraine.  He reports a significant past medical history of the same.  Patient notes approximate 4 months ago he took a new job causing significant stress.  He notes since that time he has had migraine type symptoms.  He notes that symptoms started in the posterior aspect of his neck and skull and radiate forward causing pressure behind his eyes.  He has been seen numerous times for the same, most recently seen at [facility] with CT head without, CT angios head with no significant findings.  He is also had MRI here with no significant findings.  Patient notes this is typical of his previous migraines, he denies any associated neurological deficits, fever, neck stiffness.  He denies any significant red flags for headache migraine.  Patient reports taking Imitrex that did not help with symptoms.  Patient was was to follow-up with [facility] neurology but has not done so yet.  Patient does note that he has decreased sensation to his hands bilateral, this is chronic in nature, likely secondary to peripheral neuropathy from diabetes, unchanged from baseline.   Past Medical History:  Diagnosis Date  . Anxiety   . Diabetes mellitus without complication (HCC)   . Hypertension     Patient Active Problem List   Diagnosis Date Noted  . Diabetes mellitus without complication (HCC) 10/18/2015  . Morbid obesity (HCC) 10/18/2015  . Hypertension 10/18/2015  . Chronic right shoulder pain 10/18/2015  . Muscle spasm of right shoulder 10/18/2015  . Anxiety 10/18/2015  . Tobacco abuse 10/18/2015    Past Surgical History:  Procedure Laterality Date  . MOUTH SURGERY         Home Medications    Prior to Admission  medications   Medication Sig Start Date End Date Taking? Authorizing Provider  B Complex Vitamins (VITAMIN B COMPLEX) TABS Take 1 tablet by mouth 2 (two) times daily.    Historical Provider, MD  cyclobenzaprine (FLEXERIL) 10 MG tablet Take 0.5-1 tablets (5-10 mg total) by mouth 3 (three) times daily as needed for muscle spasms. 10/18/15   Smitty Cords, DO  lisinopril (PRINIVIL,ZESTRIL) 10 MG tablet Take 1 tablet (10 mg total) by mouth daily. 10/18/15   Smitty Cords, DO  metFORMIN (GLUCOPHAGE) 1000 MG tablet Take 1 tablet (1,000 mg total) by mouth 2 (two) times daily with a meal. 10/18/15   Smitty Cords, DO  naproxen (NAPROSYN) 375 MG tablet Take 1 tablet (375 mg total) by mouth 2 (two) times daily. 02/02/16   Rise Mu, PA-C  naproxen sodium (ALEVE) 220 MG tablet Take 440 mg by mouth 2 (two) times daily as needed (pain).    Historical Provider, MD  Omega-3 Fatty Acids (FISH OIL) 1000 MG CAPS Take 1,000 mg by mouth 2 (two) times daily.    Historical Provider, MD  sertraline (ZOLOFT) 100 MG tablet Take 1 tablet (100 mg total) by mouth daily. 10/18/15   Smitty Cords, DO    Family History Family History  Problem Relation Age of Onset  . COPD Mother   . Heart disease Mother   . Hypertension Mother   . Fibromyalgia Mother   . Diabetes Mother   .  Drug abuse Father   . Alcohol abuse Father   . Hypertension Sister     Social History Social History  Substance Use Topics  . Smoking status: Current Every Day Smoker    Packs/day: 0.20    Years: 10.00  . Smokeless tobacco: Former Neurosurgeon     Comment: Vapes and some smokeless tobacco  . Alcohol use 0.6 oz/week    1 Cans of beer per week     Allergies   Celestone [betamethasone] and Prednisone   Review of Systems Review of Systems  Constitutional: Negative for fever.  Gastrointestinal: Negative for nausea and vomiting.  Musculoskeletal: Positive for neck pain. Negative for neck stiffness.    Neurological: Positive for numbness and headaches.  All other systems reviewed and are negative.    Physical Exam Updated Vital Signs BP (!) 138/91 (BP Location: Left Arm)   Pulse 74   Temp 98 F (36.7 C) (Oral)   Resp 16   SpO2 99%   Physical Exam  Constitutional: He is oriented to person, place, and time. He appears well-developed and well-nourished. No distress.  HENT:  Head: Normocephalic.  Eyes: Conjunctivae and EOM are normal. Pupils are equal, round, and reactive to light. Right eye exhibits no discharge. Left eye exhibits no discharge. No scleral icterus.  Neck: Normal range of motion. Neck supple. No JVD present.  Pulmonary/Chest: No stridor.  Musculoskeletal: Normal range of motion. He exhibits no edema or tenderness.  Lymphadenopathy:    He has no cervical adenopathy.  Neurological: He is alert and oriented to person, place, and time. He has normal strength. He displays no atrophy and no tremor. No cranial nerve deficit or sensory deficit. He exhibits normal muscle tone. He displays a negative Romberg sign. He displays no seizure activity. Coordination and gait normal. GCS eye subscore is 4. GCS verbal subscore is 5. GCS motor subscore is 6.  Reflex Scores:      Patellar reflexes are 2+ on the right side and 2+ on the left side. Skin: Skin is warm. He is not diaphoretic.  Nursing note and vitals reviewed.   ED Treatments / Results  Labs (all labs ordered are listed, but only abnormal results are displayed) Labs Reviewed - No data to display  EKG  EKG Interpretation None       Radiology No results found.  Procedures Procedures (including critical care time)  Medications Ordered in ED Medications  ketorolac (TORADOL) 30 MG/ML injection 30 mg (30 mg Intramuscular Given 05/27/16 1210)  metoCLOPramide (REGLAN) tablet 10 mg (10 mg Oral Given 05/27/16 1210)  diphenhydrAMINE (BENADRYL) capsule 25 mg (25 mg Oral Given 05/27/16 1210)     Initial Impression /  Assessment and Plan / ED Course  I have reviewed the triage vital signs and the nursing notes.  Pertinent labs & imaging results that were available during my care of the patient were reviewed by me and considered in my medical decision making (see chart for details).     Final Clinical Impressions(s) / ED Diagnoses   Final diagnoses:  Migraine with status migrainosus, not intractable, unspecified migraine type      Therapeutics: toradol, reglan, diphenhydramine    Assessment/Plan: 31 year old male presents today with migraine.  This is typical of previous, no red flags.  I discussed starting IV for medication administration and fluid administration, patient would like to have intramuscular injection.  I find this is reasonable.  He will be given symptomatic treatment here in the ED. patient will have outpatient  neurology follow-up.  Headache improved here patient requesting discharge home.  Patient given neurology follow-up.  Pt verbalized understanding and agreement to today's plan had no further questions or concerns at the time of discharge.     New Prescriptions New Prescriptions   No medications on file     Eyvonne Mechanic, PA-C 05/27/16 1340    Melene Plan, DO 05/27/16 1517

## 2016-05-27 NOTE — ED Triage Notes (Signed)
Pt reports recent hx of migraines over the past month. Has been seen at [facility] for same and given imitrex, no longer having relief with it. Having migraine x 4 days with sensitivity to light and vision changes.

## 2016-05-27 NOTE — ED Notes (Signed)
Walked patient to the bathroom  

## 2016-06-19 ENCOUNTER — Ambulatory Visit: Payer: BLUE CROSS/BLUE SHIELD | Admitting: Neurology

## 2016-06-19 ENCOUNTER — Telehealth: Payer: Self-pay

## 2016-06-19 NOTE — Telephone Encounter (Signed)
New patient did not show to appt today.

## 2016-06-21 ENCOUNTER — Encounter: Payer: Self-pay | Admitting: Neurology

## 2016-06-24 ENCOUNTER — Emergency Department (HOSPITAL_COMMUNITY)
Admission: EM | Admit: 2016-06-24 | Discharge: 2016-06-24 | Disposition: A | Discharge status: Home / Self Care | Payer: BLUE CROSS/BLUE SHIELD | Attending: Emergency Medicine | Admitting: Emergency Medicine

## 2016-06-24 ENCOUNTER — Emergency Department (HOSPITAL_COMMUNITY): Payer: BLUE CROSS/BLUE SHIELD

## 2016-06-24 ENCOUNTER — Encounter (HOSPITAL_COMMUNITY): Payer: Self-pay | Admitting: Emergency Medicine

## 2016-06-24 DIAGNOSIS — Z79899 Other long term (current) drug therapy: Secondary | ICD-10-CM | POA: Diagnosis not present

## 2016-06-24 DIAGNOSIS — F17200 Nicotine dependence, unspecified, uncomplicated: Secondary | ICD-10-CM | POA: Insufficient documentation

## 2016-06-24 DIAGNOSIS — I10 Essential (primary) hypertension: Secondary | ICD-10-CM | POA: Diagnosis not present

## 2016-06-24 DIAGNOSIS — E119 Type 2 diabetes mellitus without complications: Secondary | ICD-10-CM | POA: Diagnosis not present

## 2016-06-24 DIAGNOSIS — N132 Hydronephrosis with renal and ureteral calculous obstruction: Secondary | ICD-10-CM | POA: Insufficient documentation

## 2016-06-24 DIAGNOSIS — R109 Unspecified abdominal pain: Secondary | ICD-10-CM | POA: Diagnosis present

## 2016-06-24 DIAGNOSIS — N210 Calculus in bladder: Secondary | ICD-10-CM | POA: Insufficient documentation

## 2016-06-24 DIAGNOSIS — N200 Calculus of kidney: Secondary | ICD-10-CM

## 2016-06-24 LAB — CBC
HEMATOCRIT: 43.5 % (ref 39.0–52.0)
HEMOGLOBIN: 14.8 g/dL (ref 13.0–17.0)
MCH: 28.8 pg (ref 26.0–34.0)
MCHC: 34 g/dL (ref 30.0–36.0)
MCV: 84.8 fL (ref 78.0–100.0)
Platelets: 236 10*3/uL (ref 150–400)
RBC: 5.13 MIL/uL (ref 4.22–5.81)
RDW: 12.4 % (ref 11.5–15.5)
WBC: 16.8 10*3/uL — ABNORMAL HIGH (ref 4.0–10.5)

## 2016-06-24 LAB — BASIC METABOLIC PANEL
ANION GAP: 12 (ref 5–15)
BUN: 8 mg/dL (ref 6–20)
CHLORIDE: 105 mmol/L (ref 101–111)
CO2: 22 mmol/L (ref 22–32)
Calcium: 8.7 mg/dL — ABNORMAL LOW (ref 8.9–10.3)
Creatinine, Ser: 0.89 mg/dL (ref 0.61–1.24)
GFR calc Af Amer: >60 mL/min (ref >60)
GLUCOSE: 161 mg/dL — AB (ref 65–99)
POTASSIUM: 3.3 mmol/L — AB (ref 3.5–5.1)
Sodium: 139 mmol/L (ref 135–145)

## 2016-06-24 LAB — URINALYSIS, ROUTINE W REFLEX MICROSCOPIC
Bilirubin Urine: NEGATIVE
GLUCOSE, UA: 50 mg/dL — AB
Ketones, ur: NEGATIVE mg/dL
LEUKOCYTES UA: NEGATIVE
NITRITE: NEGATIVE
PH: 5 (ref 5.0–8.0)
PROTEIN: 30 mg/dL — AB
Specific Gravity, Urine: 1.019 (ref 1.005–1.030)

## 2016-06-24 MED ORDER — ONDANSETRON 4 MG PO TBDP
ORAL_TABLET | ORAL | Status: AC
  Filled 2016-06-24: qty 1

## 2016-06-24 MED ORDER — SODIUM CHLORIDE 0.9 % IV BOLUS (SEPSIS)
1000.0000 mL | Freq: Once | INTRAVENOUS | Status: AC
  Administered 2016-06-24: 1000 mL via INTRAVENOUS

## 2016-06-24 MED ORDER — OXYCODONE-ACETAMINOPHEN 5-325 MG PO TABS: 1.0000 | ORAL_TABLET | ORAL | 0 refills | Status: DC | PRN

## 2016-06-24 MED ORDER — ONDANSETRON 4 MG PO TBDP: 4.0000 mg | ORAL_TABLET | Freq: Three times a day (TID) | ORAL | 0 refills | Status: DC | PRN

## 2016-06-24 MED ORDER — OXYCODONE-ACETAMINOPHEN 5-325 MG PO TABS
1.0000 | ORAL_TABLET | Freq: Once | ORAL | Status: AC
  Administered 2016-06-24: 1 via ORAL
  Filled 2016-06-24: qty 1

## 2016-06-24 MED ORDER — ONDANSETRON 4 MG PO TBDP
4.0000 mg | ORAL_TABLET | Freq: Once | ORAL | Status: AC
  Administered 2016-06-24: 4 mg via ORAL

## 2016-06-24 MED ORDER — POTASSIUM CHLORIDE CRYS ER 20 MEQ PO TBCR
40.0000 meq | EXTENDED_RELEASE_TABLET | Freq: Once | ORAL | Status: AC
  Administered 2016-06-24: 40 meq via ORAL
  Filled 2016-06-24: qty 2

## 2016-06-24 MED ORDER — TAMSULOSIN HCL 0.4 MG PO CAPS: 0.4000 mg | ORAL_CAPSULE | Freq: Two times a day (BID) | ORAL | 0 refills | Status: DC

## 2016-06-24 MED ORDER — KETOROLAC TROMETHAMINE 30 MG/ML IJ SOLN
30.0000 mg | Freq: Once | INTRAMUSCULAR | Status: DC
  Filled 2016-06-24: qty 1

## 2016-06-24 NOTE — Discharge Instructions (Signed)
Take your medication as prescribed as needed for nausea and vomiting. Continue drinking fluids at home to remain hydrated. I recommend using a strainer you received in the emergency department to strain your urine until you pass your kidney stone. I recommend following up at the urology clinic listed below for follow-up evaluation regarding your kidney stones. Return to the emergency department if symptoms worsen or new onset of fever, abdominal pain, vomiting, acute fluids down, burning with urination, difficulty urinating.

## 2016-06-24 NOTE — ED Provider Notes (Signed)
[facility] Provider Note   CSN: [id number] Arrival date & time: 06/24/16  0608     History   Chief Complaint Chief Complaint  Patient presents with  . Flank Pain    HPI Douglas Jackson is a 31 y.o. male.  HPI   Patient is a 31 year old male with history of diabetes who presents the ED with complaint of right flank pain, onset 2 AM. Patient reports having sudden onset sharp constant waxing and waning pain to his right flank that started when he was laying in bed trying to go to sleep. Denies any aggravating or alleviating factors. Patient reports the pain will intermittently radiating around to the right side of his abdomen. Endorses associated nausea and vomiting. Patient reports having similar symptoms approximately 7 years ago when he passed a kidney stone. Denies fever, chills, chest pain, shortness of breath, hematemesis, hematuria, dysuria, diarrhea. Denies taking any medications prior to arrival. Denies history of abdominal surgeries.  Past Medical History:  Diagnosis Date  . Anxiety   . Diabetes mellitus without complication (HCC)   . Hypertension     Patient Active Problem List   Diagnosis Date Noted  . Diabetes mellitus without complication (HCC) 10/18/2015  . Morbid obesity (HCC) 10/18/2015  . Hypertension 10/18/2015  . Chronic right shoulder pain 10/18/2015  . Muscle spasm of right shoulder 10/18/2015  . Anxiety 10/18/2015  . Tobacco abuse 10/18/2015    Past Surgical History:  Procedure Laterality Date  . MOUTH SURGERY         Home Medications    Prior to Admission medications   Medication Sig Start Date End Date Taking? Authorizing Provider  B Complex Vitamins (VITAMIN B COMPLEX) TABS Take 1 tablet by mouth 2 (two) times daily.    [provider]  cyclobenzaprine (FLEXERIL) 10 MG tablet Take 0.5-1 tablets (5-10 mg total) by mouth 3 (three) times daily as needed for muscle spasms. 10/18/15   Karamalegos, Netta Neat, DO  lisinopril  (PRINIVIL,ZESTRIL) 10 MG tablet Take 1 tablet (10 mg total) by mouth daily. 10/18/15   Karamalegos, Netta Neat, DO  metFORMIN (GLUCOPHAGE) 1000 MG tablet Take 1 tablet (1,000 mg total) by mouth 2 (two) times daily with a meal. 10/18/15   Karamalegos, Netta Neat, DO  naproxen (NAPROSYN) 375 MG tablet Take 1 tablet (375 mg total) by mouth 2 (two) times daily. 02/02/16   Demetrios Loll T, PA-C  naproxen sodium (ALEVE) 220 MG tablet Take 440 mg by mouth 2 (two) times daily as needed (pain).    [provider]  Omega-3 Fatty Acids (FISH OIL) 1000 MG CAPS Take 1,000 mg by mouth 2 (two) times daily.    [provider]  ondansetron (ZOFRAN ODT) 4 MG disintegrating tablet Take 1 tablet (4 mg total) by mouth every 8 (eight) hours as needed for nausea or vomiting. 06/24/16   Barrett Henle, PA-C  oxyCODONE-acetaminophen (PERCOCET/ROXICET) 5-325 MG tablet Take 1 tablet by mouth every 4 (four) hours as needed for severe pain. 06/24/16   Barrett Henle, PA-C  sertraline (ZOLOFT) 100 MG tablet Take 1 tablet (100 mg total) by mouth daily. 10/18/15   Karamalegos, Netta Neat, DO  tamsulosin (FLOMAX) 0.4 MG CAPS capsule Take 1 capsule (0.4 mg total) by mouth 2 (two) times daily. 06/24/16   Barrett Henle, PA-C    Family History Family History  Problem Relation Age of Onset  . COPD Mother   . Heart disease Mother   . Hypertension Mother   .  Fibromyalgia Mother   . Diabetes Mother   . Drug abuse Father   . Alcohol abuse Father   . Hypertension Sister     Social History Social History  Substance Use Topics  . Smoking status: Current Every Day Smoker    Packs/day: 0.20    Years: 10.00  . Smokeless tobacco: Former Neurosurgeon     Comment: Vapes and some smokeless tobacco  . Alcohol use 0.6 oz/week    1 Cans of beer per week     Allergies   Celestone [betamethasone]; Prednisone; and Toradol [ketorolac tromethamine]   Review of Systems Review of Systems    Gastrointestinal: Positive for nausea and vomiting.  Genitourinary: Positive for flank pain (right).  All other systems reviewed and are negative.    Physical Exam Updated Vital Signs BP (!) 113/103 (BP Location: Left Arm)   Pulse 79   Temp 97.6 F (36.4 C) (Oral)   Resp 19   SpO2 97%   Physical Exam  Constitutional: He is oriented to person, place, and time. He appears well-developed and well-nourished. No distress.  HENT:  Head: Normocephalic and atraumatic.  Mouth/Throat: Oropharynx is clear and moist. No oropharyngeal exudate.  Eyes: Conjunctivae and EOM are normal. Right eye exhibits no discharge. Left eye exhibits no discharge. No scleral icterus.  Neck: Normal range of motion. Neck supple.  Cardiovascular: Normal rate, regular rhythm, normal heart sounds and intact distal pulses.   Pulmonary/Chest: Effort normal and breath sounds normal. No respiratory distress. He has no wheezes. He has no rales. He exhibits no tenderness.  Abdominal: Soft. Normal appearance and bowel sounds are normal. He exhibits no distension and no mass. There is no tenderness. There is CVA tenderness (right). There is no rigidity, no rebound and no guarding. No hernia.  Musculoskeletal: Normal range of motion. He exhibits no edema.  Neurological: He is alert and oriented to person, place, and time.  Skin: Skin is warm and dry. He is not diaphoretic.  Nursing note and vitals reviewed.    ED Treatments / Results  Labs (all labs ordered are listed, but only abnormal results are displayed) Labs Reviewed  URINALYSIS, ROUTINE W REFLEX MICROSCOPIC - Abnormal; Notable for the following:       Result Value   Glucose, UA 50 (*)    Hgb urine dipstick LARGE (*)    Protein, ur 30 (*)    Bacteria, UA RARE (*)    Squamous Epithelial / LPF 0-5 (*)    All other components within normal limits  BASIC METABOLIC PANEL - Abnormal; Notable for the following:    Potassium 3.3 (*)    Glucose, Bld 161 (*)     Calcium 8.7 (*)    All other components within normal limits  CBC - Abnormal; Notable for the following:    WBC 16.8 (*)    All other components within normal limits    EKG  EKG Interpretation None       Radiology Ct Renal Stone Study  Result Date: 06/24/2016 CLINICAL DATA:  Sudden onset right-sided flank pain. EXAM: CT ABDOMEN AND PELVIS WITHOUT CONTRAST TECHNIQUE: Multidetector CT imaging of the abdomen and pelvis was performed following the standard protocol without IV contrast. COMPARISON:  None. FINDINGS: Lower chest: Negative. Hepatobiliary: Liver and gallbladder are unremarkable. No biliary ductal dilatation. Pancreas: Negative. Spleen: Negative. Adrenals/Urinary Tract: Adrenal glands are unremarkable. Mild right renal edema and mild right hydronephrosis secondary to a 1-2 mm stone in the bladder, at the right ureteral orifice.  No additional urinary stones. Kidneys are otherwise unremarkable. Left ureter is decompressed. Bladder is otherwise unremarkable. Stomach/Bowel: Stomach, small bowel, appendix and colon are unremarkable. Vascular/Lymphatic: Vascular structures are unremarkable. Scattered lymph nodes are not enlarged by CT size criteria. Reproductive: Prostate is normal in size. Other: No free fluid.  Mesenteries and peritoneum are unremarkable. Musculoskeletal: Negative. IMPRESSION: Mild right hydronephrosis secondary to a 1-2 mm stone in the bladder, at the right ureteral orifice. Electronically Signed   By: Leanna BattlesMelinda  Blietz M.D.   On: 06/24/2016 07:45    Procedures Procedures (including critical care time)  Medications Ordered in ED Medications  ondansetron (ZOFRAN-ODT) 4 MG disintegrating tablet (not administered)  ketorolac (TORADOL) 30 MG/ML injection 30 mg (30 mg Intravenous Not Given 06/24/16 0821)  ondansetron (ZOFRAN-ODT) disintegrating tablet 4 mg (4 mg Oral Given 06/24/16 0624)  sodium chloride 0.9 % bolus 1,000 mL (1,000 mLs Intravenous New Bag/Given 06/24/16 0818)    potassium chloride SA (K-DUR,KLOR-CON) CR tablet 40 mEq (40 mEq Oral Given 06/24/16 0818)  oxyCODONE-acetaminophen (PERCOCET/ROXICET) 5-325 MG per tablet 1 tablet (1 tablet Oral Given 06/24/16 0818)     Initial Impression / Assessment and Plan / ED Course  I have reviewed the triage vital signs and the nursing notes.  Pertinent labs & imaging results that were available during my care of the patient were reviewed by me and considered in my medical decision making (see chart for details).     Pt has been diagnosed with a Kidney Stone via CT. There is no evidence of significant hydronephrosis, serum creatine WNL, vitals sign stable and the pt does not have irratractable vomiting, tolerating PO. Pt will be dc home with pain medications & has been advised to follow up with PCP/urology.    Final Clinical Impressions(s) / ED Diagnoses   Final diagnoses:  Kidney stone    New Prescriptions New Prescriptions   ONDANSETRON (ZOFRAN ODT) 4 MG DISINTEGRATING TABLET    Take 1 tablet (4 mg total) by mouth every 8 (eight) hours as needed for nausea or vomiting.   OXYCODONE-ACETAMINOPHEN (PERCOCET/ROXICET) 5-325 MG TABLET    Take 1 tablet by mouth every 4 (four) hours as needed for severe pain.   TAMSULOSIN (FLOMAX) 0.4 MG CAPS CAPSULE    Take 1 capsule (0.4 mg total) by mouth 2 (two) times daily.     Barrett Henleadeau, Nicole Elizabeth, PA-C 06/24/16 [id number]    Doug SouJacubowitz, Sam, MD 06/24/16 [id number]

## 2016-10-17 ENCOUNTER — Emergency Department: Payer: Self-pay

## 2016-10-17 ENCOUNTER — Encounter: Payer: Self-pay | Admitting: *Deleted

## 2016-10-17 ENCOUNTER — Emergency Department
Admission: EM | Admit: 2016-10-17 | Discharge: 2016-10-17 | Disposition: A | Discharge status: Home / Self Care | Payer: Self-pay | Attending: Emergency Medicine | Admitting: Emergency Medicine

## 2016-10-17 DIAGNOSIS — M25519 Pain in unspecified shoulder: Secondary | ICD-10-CM

## 2016-10-17 DIAGNOSIS — M75101 Unspecified rotator cuff tear or rupture of right shoulder, not specified as traumatic: Secondary | ICD-10-CM

## 2016-10-17 DIAGNOSIS — I10 Essential (primary) hypertension: Secondary | ICD-10-CM | POA: Insufficient documentation

## 2016-10-17 DIAGNOSIS — Z79899 Other long term (current) drug therapy: Secondary | ICD-10-CM | POA: Insufficient documentation

## 2016-10-17 DIAGNOSIS — E119 Type 2 diabetes mellitus without complications: Secondary | ICD-10-CM | POA: Insufficient documentation

## 2016-10-17 DIAGNOSIS — M25511 Pain in right shoulder: Secondary | ICD-10-CM | POA: Insufficient documentation

## 2016-10-17 DIAGNOSIS — F17200 Nicotine dependence, unspecified, uncomplicated: Secondary | ICD-10-CM | POA: Insufficient documentation

## 2016-10-17 DIAGNOSIS — M75102 Unspecified rotator cuff tear or rupture of left shoulder, not specified as traumatic: Secondary | ICD-10-CM

## 2016-10-17 DIAGNOSIS — Z7984 Long term (current) use of oral hypoglycemic drugs: Secondary | ICD-10-CM | POA: Insufficient documentation

## 2016-10-17 MED ORDER — MELOXICAM 15 MG PO TABS: 15.0000 mg | ORAL_TABLET | Freq: Every day | ORAL | 0 refills | Status: AC

## 2016-10-17 MED ORDER — IBUPROFEN 800 MG PO TABS
800.0000 mg | ORAL_TABLET | Freq: Once | ORAL | Status: AC
  Administered 2016-10-17: 800 mg via ORAL
  Filled 2016-10-17: qty 1

## 2016-10-17 NOTE — ED Triage Notes (Signed)
States bilateral shoulder pain for several weeks

## 2016-10-17 NOTE — ED Provider Notes (Signed)
[facility]-EMERGENCY DEPARTMENT Provider Note   CSN: [id number] Arrival date & time: 10/17/16  1738     History   Chief Complaint Chief Complaint  Patient presents with  . Shoulder Pain    HPI Douglas Jackson is a 31 y.o. male presents to the emergency department for bilateral shoulder pain. Right greater than left. Patient states that he's had pain for a long time, the last 2 months pain has been increasing. He came in today because the pain was severe while working. 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 He points to bilateral anterior shoulder joints. Denies any recent trauma or injury. He has not been taking any medications for the pain. Pain is moderate to severe. Pain is using worse with lying on the shoulders at nighttime. He describes the pain as aching with no numbness tingling or radicular symptoms. He denies any neck pain. No chest pain or shortness of breath. No tick bites.  HPI  Past Medical History:  Diagnosis Date  . Anxiety   . Diabetes mellitus without complication (HCC)   . Hypertension     Patient Active Problem List   Diagnosis Date Noted  . Diabetes mellitus without complication (HCC) 10/18/2015  . Morbid obesity (HCC) 10/18/2015  . Hypertension 10/18/2015  . Chronic right shoulder pain 10/18/2015  . Muscle spasm of right shoulder 10/18/2015  . Anxiety 10/18/2015  . Tobacco abuse 10/18/2015    Past Surgical History:  Procedure Laterality Date  . MOUTH SURGERY         Home Medications    Prior to Admission medications   Medication Sig Start Date End Date Taking? Authorizing Provider  B Complex Vitamins (VITAMIN B COMPLEX) TABS Take 1 tablet by mouth 2 (two) times daily.    [provider]  cyclobenzaprine (FLEXERIL) 10 MG tablet Take 0.5-1 tablets (5-10 mg total) by mouth 3 (three) times daily as needed for muscle spasms. 10/18/15   Karamalegos, Netta Neat, DO  lisinopril (PRINIVIL,ZESTRIL) 10 MG  tablet Take 1 tablet (10 mg total) by mouth daily. 10/18/15   Karamalegos, Netta Neat, DO  meloxicam (MOBIC) 15 MG tablet Take 1 tablet (15 mg total) by mouth daily. 10/17/16 10/17/17  Evon Slack, PA-C  metFORMIN (GLUCOPHAGE) 1000 MG tablet Take 1 tablet (1,000 mg total) by mouth 2 (two) times daily with a meal. 10/18/15   Karamalegos, Netta Neat, DO  naproxen (NAPROSYN) 375 MG tablet Take 1 tablet (375 mg total) by mouth 2 (two) times daily. 02/02/16   Demetrios Loll T, PA-C  naproxen sodium (ALEVE) 220 MG tablet Take 440 mg by mouth 2 (two) times daily as needed (pain).    [provider]  Omega-3 Fatty Acids (FISH OIL) 1000 MG CAPS Take 1,000 mg by mouth 2 (two) times daily.    [provider]  ondansetron (ZOFRAN ODT) 4 MG disintegrating tablet Take 1 tablet (4 mg total) by mouth every 8 (eight) hours as needed for nausea or vomiting. 06/24/16   Barrett Henle, PA-C  oxyCODONE-acetaminophen (PERCOCET/ROXICET) 5-325 MG tablet Take 1 tablet by mouth every 4 (four) hours as needed for severe pain. 06/24/16   Barrett Henle, PA-C  sertraline (ZOLOFT) 100 MG tablet Take 1 tablet (100 mg total) by mouth daily. 10/18/15   Karamalegos, Netta Neat, DO  tamsulosin (FLOMAX) 0.4 MG CAPS capsule Take 1 capsule (0.4 mg total) by mouth 2 (two) times daily. 06/24/16   Barrett Henle, PA-C  Family History Family History  Problem Relation Age of Onset  . COPD Mother   . Heart disease Mother   . Hypertension Mother   . Fibromyalgia Mother   . Diabetes Mother   . Drug abuse Father   . Alcohol abuse Father   . Hypertension Sister     Social History Social History  Substance Use Topics  . Smoking status: Current Every Day Smoker    Packs/day: 0.20    Years: 10.00  . Smokeless tobacco: Former Neurosurgeon     Comment: Vapes and some smokeless tobacco  . Alcohol use 0.6 oz/week    1 Cans of beer per week     Allergies   Celestone [betamethasone];  Prednisone; and Toradol [ketorolac tromethamine]   Review of Systems Review of Systems  Constitutional: Negative for fever.  Respiratory: Negative for shortness of breath.   Cardiovascular: Negative for chest pain.  Gastrointestinal: Negative for abdominal pain.  Genitourinary: Negative for difficulty urinating, dysuria and urgency.  Musculoskeletal: Positive for arthralgias. Negative for back pain, gait problem, joint swelling, myalgias and neck pain.  Skin: Negative for rash.  Neurological: Negative for dizziness and headaches.     Physical Exam Updated Vital Signs BP (!) 160/92 (BP Location: Left Arm)   Pulse 87   Temp 99.1 F (37.3 C) (Oral)   Resp 18   Ht  (1.753 m)   Wt 109.3 kg (241 lb)   SpO2 100%   BMI 35.59 kg/m   Physical Exam  Constitutional: He is oriented to person, place, and time. He appears well-developed and well-nourished.  HENT:  Head: Normocephalic and atraumatic.  Eyes: Conjunctivae are normal.  Neck: Normal range of motion.  Cardiovascular: Normal rate.   Pulmonary/Chest: Effort normal. No respiratory distress.  Musculoskeletal:  Examination of bilateral shoulder show patient has active range of motion of 100 flexion and abduction. Passively he can be increased to 130 with pain. He has normal internal rotation and slightly limited passive external rotation bilaterally. Has a positive Hawkins impingement test bilaterally. He has painful external rotation. He is neurovascular intact in bilateral upper extremities with no weakness with grip, biceps, triceps strength. Mild pain and weakness for supraspinatus strength testing. 2+ radial pulse bilaterally. Normal neck range of motion with no spinous process tenderness.  Neurological: He is alert and oriented to person, place, and time.  Skin: Skin is warm. No rash noted.  Psychiatric: He has a normal mood and affect. His behavior is normal. Thought content normal.     ED Treatments / Results   Labs (all labs ordered are listed, but only abnormal results are displayed) Labs Reviewed - No data to display  EKG  EKG Interpretation None       Radiology Dg Shoulder Right  Result Date: 10/17/2016 CLINICAL DATA:  Bilateral shoulder pain for several months, no known injury, initial encounter EXAM: RIGHT SHOULDER - 2+ VIEW COMPARISON:  09/01/2013 FINDINGS: There is no evidence of fracture or dislocation. There is no evidence of arthropathy or other focal bone abnormality. Soft tissues are unremarkable. IMPRESSION: No acute abnormality noted. Electronically Signed   By: Alcide Clever M.D.   On: 10/17/2016 18:48   Dg Shoulder Left  Result Date: 10/17/2016 CLINICAL DATA:  Shoulder pain for several months, no known injury, initial encounter EXAM: LEFT SHOULDER - 2+ VIEW COMPARISON:  None. FINDINGS: There is no evidence of fracture or dislocation. There is no evidence of arthropathy or other focal bone abnormality. Soft tissues are unremarkable. IMPRESSION:  No acute abnormality noted. Electronically Signed   By: Alcide CleverMark  Lukens M.D.   On: 10/17/2016 18:50    Procedures Procedures (including critical care time)  Medications Ordered in ED Medications  ibuprofen (ADVIL,MOTRIN) tablet 800 mg (800 mg Oral Given 10/17/16 1843)     Initial Impression / Assessment and Plan / ED Course  I have reviewed the triage vital signs and the nursing notes.  Pertinent labs & imaging results that were available during my care of the patient were reviewed by me and considered in my medical decision making (see chart for details).     31 year old male with bilateral shoulder pain 2 months. No trauma or injury. Patient has pain and stiffness as well as mild weakness with shoulder abduction and flexion. X-ray showed no evidence of acute bony abnormality or arthropathy. Patient was started with meloxicam 15 mg daily. He will also take Tylenol as needed. He will follow-up with orthopedics. He is educated on  signs and symptoms return to the ED for.  Final Clinical Impressions(s) / ED Diagnoses   Final diagnoses:  Shoulder pain  Rotator cuff syndrome, left  Rotator cuff syndrome, right    New Prescriptions Discharge Medication List as of 10/17/2016  6:45 PM    START taking these medications   Details  meloxicam (MOBIC) 15 MG tablet Take 1 tablet (15 mg total) by mouth daily., Starting Tue 10/17/2016, Until Wed 10/17/2017, Print         Evon SlackGaines, Thomas C, PA-C 10/17/16 1852    Rockne MenghiniNorman, Anne-Caroline, MD 10/17/16 2137

## 2016-10-17 NOTE — Discharge Instructions (Signed)
Please take meloxicam daily with food. Take Tylenol as needed for additional pain relief. Call orthopedics tomorrow to schedule follow-up appointment. Return to the ER for any worsening symptoms or changes in her health.

## 2016-12-22 ENCOUNTER — Encounter: Payer: Self-pay | Admitting: Internal Medicine

## 2016-12-22 ENCOUNTER — Ambulatory Visit (INDEPENDENT_AMBULATORY_CARE_PROVIDER_SITE_OTHER): Payer: No Typology Code available for payment source | Admitting: Internal Medicine

## 2016-12-22 VITALS — BP 150/98 | HR 81 | Temp 98.4°F | Wt 258.0 lb

## 2016-12-22 DIAGNOSIS — F419 Anxiety disorder, unspecified: Secondary | ICD-10-CM | POA: Diagnosis not present

## 2016-12-22 DIAGNOSIS — I10 Essential (primary) hypertension: Secondary | ICD-10-CM | POA: Diagnosis not present

## 2016-12-22 DIAGNOSIS — E119 Type 2 diabetes mellitus without complications: Secondary | ICD-10-CM

## 2016-12-22 MED ORDER — BUSPIRONE HCL 10 MG PO TABS: 10.0000 mg | ORAL_TABLET | Freq: Two times a day (BID) | ORAL | 2 refills | Status: DC

## 2016-12-22 MED ORDER — LISINOPRIL 20 MG PO TABS: 20.0000 mg | ORAL_TABLET | Freq: Every day | ORAL | 0 refills | Status: DC

## 2016-12-22 NOTE — Progress Notes (Signed)
HPI  Pt presents to the clinic today to establish care and for management of the conditions listed below. [facility]  DM 2: He reports he has not had an A1C in > 1 year. His sugars range 87-140. He is taking Metformin as prescribed but does reports some GI upset. He reports his second toe, right foot ocassionally has a hot, searing pain. This is intermittent. He does check his feet daily. His last eye exam was in 2018. He does not take flu or pneumonia shots.  HTN: His BP today is 150/98. He is taking Lisinopril 10 mg as prescribed. He denies a cough. There is no ECG on file.  Anxiety and Depresison: He reports this is triggered by being in places he is unfamiliar, and negatibe tension. He stopped his Zoloft because he did not feel it was effective. He has also failed Cymbalta and Paxil in the past. He denies SI/HI.  Flu: never Tetanus: 2009 Pneumovax: unsure Dentist: as needed  Past Medical History:  Diagnosis Date  . Anxiety   . Diabetes mellitus without complication (HCC)   . Hypertension     Current Outpatient Medications  Medication Sig Dispense Refill  . B Complex Vitamins (VITAMIN B COMPLEX) TABS Take 1 tablet by mouth 2 (two) times daily.    Marland Kitchen. glucosamine-chondroitin 500-400 MG tablet Take 1 tablet 3 (three) times daily by mouth.    Marland Kitchen. lisinopril (PRINIVIL,ZESTRIL) 10 MG tablet Take 1 tablet (10 mg total) by mouth daily. 30 tablet 5  . meloxicam (MOBIC) 15 MG tablet Take 1 tablet (15 mg total) by mouth daily. 30 tablet 0  . metFORMIN (GLUCOPHAGE) 1000 MG tablet Take 1 tablet (1,000 mg total) by mouth 2 (two) times daily with a meal. 60 tablet 5  . naproxen sodium (ALEVE) 220 MG tablet Take 440 mg by mouth 2 (two) times daily as needed (pain).    . Omega-3 Fatty Acids (FISH OIL) 1000 MG CAPS Take 1,000 mg by mouth 2 (two) times daily.    . sertraline (ZOLOFT) 100 MG tablet Take 1 tablet (100 mg total) by mouth daily. (Patient  not taking: Reported on 12/22/2016) 30 tablet 5   No current facility-administered medications for this visit.     Allergies  Allergen Reactions  . Celestone [Betamethasone] Other (See Comments)    "very angry and combative"   . Prednisone Hives  . Toradol [Ketorolac Tromethamine]     Family History  Problem Relation Age of Onset  . COPD Mother   . Heart disease Mother   . Hypertension Mother   . Fibromyalgia Mother   . Diabetes Mother   . Drug abuse Father   . Alcohol abuse Father   . Hypertension Sister   . Heart disease Maternal Grandmother   . Diabetes Maternal Grandfather   . Kidney disease Maternal Grandfather     Social History   Socioeconomic History  . Marital status: Single    Spouse name: Not on file  . Number of children: Not on file  . Years of education: GED  . Highest education level: Not on file  Social Needs  . Financial resource strain: Not on file  . Food insecurity - worry: Not on file  . Food insecurity - inability: Not on file  . Transportation needs - medical: Not on file  . Transportation needs - non-medical: Not on file  Occupational History  . 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 Smoking status: Current Every Day Smoker    Packs/day: 0.20    Years: 10.00    Pack years: 2.00  . Smokeless tobacco: Former NeurosurgeonUser  . Tobacco comment: Vapes and some smokeless tobacco  Substance and Sexual Activity  . Alcohol use: Yes    Alcohol/week: 0.6 oz    Types: 1 Cans of beer per week    Comment: rare  . Drug use: No  . Sexual activity: Not on file  Other Topics Concern  . Not on file  Social History Narrative  . Not on file    ROS:  Constitutional: Denies fever, malaise, fatigue, headache or abrupt weight changes.  HEENT: Denies eye pain, eye redness, ear pain, ringing in the ears, wax buildup, runny nose, nasal congestion, bloody nose, or sore throat. Respiratory: Denies difficulty  breathing, shortness of breath, cough or sputum production.   Cardiovascular: Denies chest pain, chest tightness, palpitations or swelling in the hands or feet.  Gastrointestinal: Denies abdominal pain, bloating, constipation, diarrhea or blood in the stool.  GU: Denies frequency, urgency, pain with urination, blood in urine, odor or discharge. Musculoskeletal: Denies decrease in range of motion, difficulty with gait, muscle pain or joint pain and swelling.  Skin: Denies redness, rashes, lesions or ulcercations.  Neurological: Pt reports nerve pain in toe. Denies dizziness, difficulty with memory, difficulty with speech or problems with balance and coordination.  Psych: Pt has a history of anxiety and depression. Denies SI/HI.  No other specific complaints in a complete review of systems (except as listed in HPI above).  PE:  BP (!) 150/98   Pulse 81   Temp 98.4 F (36.9 C) (Oral)   Wt 258 lb (117 kg)   SpO2 98%   BMI 38.10 kg/m   Wt Readings from Last 3 Encounters:  12/22/16 258 lb (117 kg)  10/17/16 241 lb (109.3 kg)  02/28/16 252 lb (114.3 kg)    General: Appears his stated age, obese in NAD. Skin: Dry and intact. No ulcerations noted. Cardiovascular: Normal rate and rhythm. S1,S2 noted.  No murmur, rubs or gallops noted.  Pulmonary/Chest: Normal effort and positive vesicular breath sounds. No respiratory distress. No wheezes, rales or ronchi noted.  Abdomen: Soft and nontender. Active bowel sounds. Musculoskeletal: No difficulty with gait.  Neurological: Alert and oriented. Sensation intact to BLE. Psychiatric: Mood and affect normal. Behavior is normal. Judgment and thought content normal.     BMET    Component Value Date/Time   NA 139 06/24/2016 0620   K 3.3 (L) 06/24/2016 0620   CL 105 06/24/2016 0620   CO2 22 06/24/2016 0620   GLUCOSE 161 (H) 06/24/2016 0620   BUN 8 06/24/2016 0620   CREATININE 0.89 06/24/2016 0620   CALCIUM 8.7 (L) 06/24/2016 0620   GFRNONAA  >60 06/24/2016 0620   GFRAA >60 06/24/2016 0620    Lipid Panel     Component Value Date/Time   CHOL 159 10/18/2015 1017   TRIG 123 10/18/2015 1017   HDL 35 (L) 10/18/2015 1017   CHOLHDL 4.5 10/18/2015 1017   VLDL 25 10/18/2015 1017   LDLCALC 99 10/18/2015 1017    CBC    Component Value Date/Time   WBC 16.8 (H) 06/24/2016 0620   RBC 5.13 06/24/2016 0620   HGB 14.8 06/24/2016 0620   HCT 43.5 06/24/2016 0620   PLT 236 06/24/2016 0620   MCV 84.8 06/24/2016 0620   MCH 28.8 06/24/2016 [id number]  MCHC 34.0 06/24/2016 0620   RDW 12.4 06/24/2016 0620   LYMPHSABS 4.1 (H) 09/10/2015 2145   MONOABS 0.7 09/10/2015 2145   EOSABS 0.2 09/10/2015 2145   BASOSABS 0.0 09/10/2015 2145    Hgb A1C Lab Results  Component Value Date   HGBA1C 7.3 10/18/2015     Assessment and Plan:

## 2016-12-23 LAB — COMPREHENSIVE METABOLIC PANEL
AG Ratio: 1.4 (calc) (ref 1.0–2.5)
ALBUMIN MSPROF: 4.1 g/dL (ref 3.6–5.1)
ALKALINE PHOSPHATASE (APISO): 116 U/L — AB (ref 40–115)
ALT: 42 U/L (ref 9–46)
AST: 22 U/L (ref 10–40)
BILIRUBIN TOTAL: 0.3 mg/dL (ref 0.2–1.2)
BUN: 12 mg/dL (ref 7–25)
CALCIUM: 9.4 mg/dL (ref 8.6–10.3)
CO2: 27 mmol/L (ref 20–32)
Chloride: 105 mmol/L (ref 98–110)
Creat: 0.84 mg/dL (ref 0.60–1.35)
Globulin: 3 g/dL (calc) (ref 1.9–3.7)
Glucose, Bld: 239 mg/dL — ABNORMAL HIGH (ref 65–99)
POTASSIUM: 5 mmol/L (ref 3.5–5.3)
Sodium: 141 mmol/L (ref 135–146)
Total Protein: 7.1 g/dL (ref 6.1–8.1)

## 2016-12-23 LAB — LIPID PANEL
CHOL/HDL RATIO: 4.4 (calc) (ref <5.0)
CHOLESTEROL: 153 mg/dL (ref <200)
HDL: 35 mg/dL — AB (ref >40)
LDL CHOLESTEROL (CALC): 88 mg/dL
NON-HDL CHOLESTEROL (CALC): 118 mg/dL (ref <130)
TRIGLYCERIDES: 208 mg/dL — AB (ref <150)

## 2016-12-23 LAB — HEMOGLOBIN A1C
HEMOGLOBIN A1C: 5.8 %{Hb} — AB (ref <5.7)
MEAN PLASMA GLUCOSE: 120 (calc)
eAG (mmol/L): 6.6 (calc)

## 2016-12-24 ENCOUNTER — Encounter: Payer: Self-pay | Admitting: Internal Medicine

## 2016-12-24 NOTE — Assessment & Plan Note (Signed)
Uncontrolled Increase Lisinopril to 20 mg daily Discussed DASH diet and exercise for weight loss  RTC in 3 weeks for follow up of HTN

## 2016-12-24 NOTE — Assessment & Plan Note (Signed)
A1C today No microalbumin secondary to ACEI therapy Consider swithching Metformin to Glipizide due to GI issues Encouraged him to consume a low carb, low fat diet and exercise for weight loss Foot exam today Encouraged yearly eye exams He declines flu or pneumovax today

## 2016-12-24 NOTE — Patient Instructions (Signed)

## 2016-12-24 NOTE — Assessment & Plan Note (Signed)
Will trial Buspar, eRx sent to pharmacy Support offered today  Encouraged him to let me know in 4-6 weeks how he is feeling

## 2016-12-25 ENCOUNTER — Other Ambulatory Visit: Payer: Self-pay | Admitting: *Deleted

## 2016-12-25 MED ORDER — GLIPIZIDE 5 MG PO TABS: 5.0000 mg | ORAL_TABLET | Freq: Two times a day (BID) | ORAL | 2 refills | Status: DC

## 2017-01-12 ENCOUNTER — Ambulatory Visit (INDEPENDENT_AMBULATORY_CARE_PROVIDER_SITE_OTHER): Payer: No Typology Code available for payment source | Admitting: Internal Medicine

## 2017-01-12 ENCOUNTER — Encounter: Payer: Self-pay | Admitting: Internal Medicine

## 2017-01-12 DIAGNOSIS — F419 Anxiety disorder, unspecified: Secondary | ICD-10-CM

## 2017-01-12 DIAGNOSIS — I10 Essential (primary) hypertension: Secondary | ICD-10-CM

## 2017-01-12 MED ORDER — LISINOPRIL 40 MG PO TABS: 40.0000 mg | ORAL_TABLET | Freq: Every day | ORAL | 0 refills | Status: DC

## 2017-01-12 MED ORDER — BUSPIRONE HCL 10 MG PO TABS: 10.0000 mg | ORAL_TABLET | Freq: Three times a day (TID) | ORAL | 0 refills | Status: DC

## 2017-01-12 NOTE — Assessment & Plan Note (Signed)
Increase Buspar to 10 mg TID, eRx sent to pharmacy Support offered today He declines referral to psychology at this time

## 2017-01-12 NOTE — Assessment & Plan Note (Signed)
Still elevated Increase Lisinopril to 40 mg daily, eRx sent to pharmacy Reinforced DASH diet and exercise for weight loss

## 2017-01-12 NOTE — Progress Notes (Signed)
Subjective:    Patient ID: Douglas Jackson, male    DOB: 05/04/1985, 31 y.o.   MRN: [id number]  HPI  Pt presents to the clinic today for follow up of HTN. At his last visit, his Lisinopril was increased to 20 mg daily. He has been taking the medication as prescribed. He denies adverse side effects. His BP today is 138/88.  Anxiety: He was started on Buspar at his last visit. He has not noticed much improvement in his symptoms. He is interested in increasing his dosage today to see if it would be more effective. He denies SI/HI.  Review of Systems      Past Medical History:  Diagnosis Date  . Anxiety   . Diabetes mellitus without complication (HCC)   . Hypertension     Current Outpatient Medications  Medication Sig Dispense Refill  . B Complex Vitamins (VITAMIN B COMPLEX) TABS Take 1 tablet by mouth 2 (two) times daily.    . busPIRone (BUSPAR) 10 MG tablet Take 1 tablet (10 mg total) 2 (two) times daily by mouth. 60 tablet 2  . glipiZIDE (GLUCOTROL) 5 MG tablet Take 1 tablet (5 mg total) 2 (two) times daily before a meal by mouth. 60 tablet 2  . glucosamine-chondroitin 500-400 MG tablet Take 1 tablet 3 (three) times daily by mouth.    Marland Kitchen. lisinopril (PRINIVIL,ZESTRIL) 20 MG tablet Take 1 tablet (20 mg total) daily by mouth. 30 tablet 0  . meloxicam (MOBIC) 15 MG tablet Take 1 tablet (15 mg total) by mouth daily. 30 tablet 0  . metFORMIN (GLUCOPHAGE) 1000 MG tablet Take 1 tablet (1,000 mg total) by mouth 2 (two) times daily with a meal. (Patient not taking: Reported on 12/25/2016) 60 tablet 5  . naproxen sodium (ALEVE) 220 MG tablet Take 440 mg by mouth 2 (two) times daily as needed (pain).    . Omega-3 Fatty Acids (FISH OIL) 1000 MG CAPS Take 1,000 mg by mouth 2 (two) times daily.    . sertraline (ZOLOFT) 100 MG tablet Take 1 tablet (100 mg total) by mouth daily. (Patient not taking: Reported on 12/22/2016) 30 tablet 5   No current facility-administered medications for this visit.      Allergies  Allergen Reactions  . Celestone [Betamethasone] Other (See Comments)    "very angry and combative"   . Prednisone Hives  . Toradol [Ketorolac Tromethamine]     Family History  Problem Relation Age of Onset  . COPD Mother   . Heart disease Mother   . Hypertension Mother   . Fibromyalgia Mother   . Diabetes Mother   . Drug abuse Father   . Alcohol abuse Father   . Hypertension Sister   . Heart disease Maternal Grandmother   . Diabetes Maternal Grandfather   . Kidney disease Maternal Grandfather     Social History   Socioeconomic History  . Marital status: Single    Spouse name: Not on file  . Number of children: Not on file  . Years of education: GED  . Highest education level: Not on file  Social Needs  . Financial resource strain: Not on file  . Food insecurity - worry: Not on file  . Food insecurity - inability: Not on file  . Transportation needs - medical: Not on file  . Transportation needs - non-medical: Not on file  Occupational History  . 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 Smoking status:  Current Every Day Smoker    Packs/day: 0.20    Years: 10.00    Pack years: 2.00  . Smokeless tobacco: Former NeurosurgeonUser  . Tobacco comment: Vapes and some smokeless tobacco  Substance and Sexual Activity  . Alcohol use: Yes    Alcohol/week: 0.6 oz    Types: 1 Cans of beer per week    Comment: rare  . Drug use: No  . Sexual activity: Not on file  Other Topics Concern  . Not on file  Social History Narrative  . Not on file     Constitutional: Denies fever, malaise, fatigue, headache or abrupt weight changes.  Respiratory: Denies difficulty breathing, shortness of breath, cough or sputum production.   Cardiovascular: Denies chest pain, chest tightness, palpitations or swelling in the hands or feet.  Psych: Pt has a history of anxiety. Denies depression, SI/HI.  No other specific complaints in a  complete review of systems (except as listed in HPI above).  Objective:   Physical Exam  BP 138/88   Pulse 88   Temp 98.7 F (37.1 C) (Oral)   Wt 262 lb (118.8 kg)   SpO2 98%   BMI 38.69 kg/m  Wt Readings from Last 3 Encounters:  01/12/17 262 lb (118.8 kg)  12/22/16 258 lb (117 kg)  10/17/16 241 lb (109.3 kg)    General: Appears his stated age, obese in NAD. Cardiovascular: Normal rate and rhythm. S1,S2 noted.  No murmur, rubs or gallops noted.  Pulmonary/Chest: Normal effort and positive vesicular breath sounds. No respiratory distress. No wheezes, rales or ronchi noted.  Neurological: Alert and oriented.  Psychiatric: Mood and affect normal. He is slightly anxious appearing today. Judgment and thought content normal.     BMET    Component Value Date/Time   NA 141 12/22/2016 1554   K 5.0 12/22/2016 1554   CL 105 12/22/2016 1554   CO2 27 12/22/2016 1554   GLUCOSE 239 (H) 12/22/2016 1554   BUN 12 12/22/2016 1554   CREATININE 0.84 12/22/2016 1554   CALCIUM 9.4 12/22/2016 1554   GFRNONAA >60 06/24/2016 0620   GFRAA >60 06/24/2016 0620    Lipid Panel     Component Value Date/Time   CHOL 153 12/22/2016 1554   TRIG 208 (H) 12/22/2016 1554   HDL 35 (L) 12/22/2016 1554   CHOLHDL 4.4 12/22/2016 1554   VLDL 25 10/18/2015 1017   LDLCALC 99 10/18/2015 1017    CBC    Component Value Date/Time   WBC 16.8 (H) 06/24/2016 0620   RBC 5.13 06/24/2016 0620   HGB 14.8 06/24/2016 0620   HCT 43.5 06/24/2016 0620   PLT 236 06/24/2016 0620   MCV 84.8 06/24/2016 0620   MCH 28.8 06/24/2016 0620   MCHC 34.0 06/24/2016 0620   RDW 12.4 06/24/2016 0620   LYMPHSABS 4.1 (H) 09/10/2015 2145   MONOABS 0.7 09/10/2015 2145   EOSABS 0.2 09/10/2015 2145   BASOSABS 0.0 09/10/2015 2145    Hgb A1C Lab Results  Component Value Date   HGBA1C 5.8 (H) 12/22/2016            Assessment & Plan:

## 2017-01-12 NOTE — Patient Instructions (Signed)
DASH Eating Plan DASH stands for "Dietary Approaches to Stop Hypertension." The DASH eating plan is a healthy eating plan that has been shown to reduce high blood pressure (hypertension). It may also reduce your risk for type 2 diabetes, heart disease, and stroke. The DASH eating plan may also help with weight loss. What are tips for following this plan? General guidelines  Avoid eating more than 2,300 mg (milligrams) of salt (sodium) a day. If you have hypertension, you may need to reduce your sodium intake to 1,500 mg a day.  Limit alcohol intake to no more than 1 drink a day for nonpregnant women and 2 drinks a day for men. One drink equals 12 oz of beer, 5 oz of wine, or 1 oz of hard liquor.  Work with your health care provider to maintain a healthy body weight or to lose weight. Ask what an ideal weight is for you.  Get at least 30 minutes of exercise that causes your heart to beat faster (aerobic exercise) most days of the week. Activities may include walking, swimming, or biking.  Work with your health care provider or diet and nutrition specialist (dietitian) to adjust your eating plan to your individual calorie needs. Reading food labels  Check food labels for the amount of sodium per serving. Choose foods with less than 5 percent of the Daily Value of sodium. Generally, foods with less than 300 mg of sodium per serving fit into this eating plan.  To find whole grains, look for the word "whole" as the first word in the ingredient list. Shopping  Buy products labeled as "low-sodium" or "no salt added."  Buy fresh foods. Avoid canned foods and premade or frozen meals. Cooking  Avoid adding salt when cooking. Use salt-free seasonings or herbs instead of table salt or sea salt. Check with your health care provider or pharmacist before using salt substitutes.  Do not fry foods. Cook foods using healthy methods such as baking, boiling, grilling, and broiling instead.  Cook with  heart-healthy oils, such as olive, canola, soybean, or sunflower oil. Meal planning   Eat a balanced diet that includes: ? 5 or more servings of fruits and vegetables each day. At each meal, try to fill half of your plate with fruits and vegetables. ? Up to 6-8 servings of whole grains each day. ? Less than 6 oz of lean meat, poultry, or fish each day. A 3-oz serving of meat is about the same size as a deck of cards. One egg equals 1 oz. ? 2 servings of low-fat dairy each day. ? A serving of nuts, seeds, or beans 5 times each week. ? Heart-healthy fats. Healthy fats called Omega-3 fatty acids are found in foods such as flaxseeds and coldwater fish, like sardines, salmon, and mackerel.  Limit how much you eat of the following: ? Canned or prepackaged foods. ? Food that is high in trans fat, such as fried foods. ? Food that is high in saturated fat, such as fatty meat. ? Sweets, desserts, sugary drinks, and other foods with added sugar. ? Full-fat dairy products.  Do not salt foods before eating.  Try to eat at least 2 vegetarian meals each week.  Eat more home-cooked food and less restaurant, buffet, and fast food.  When eating at a restaurant, ask that your food be prepared with less salt or no salt, if possible. What foods are recommended? The items listed may not be a complete list. Talk with your dietitian about what   dietary choices are best for you. Grains Whole-grain or whole-wheat bread. Whole-grain or whole-wheat pasta. Brown rice. Oatmeal. Quinoa. Bulgur. Whole-grain and low-sodium cereals. Pita bread. Low-fat, low-sodium crackers. Whole-wheat flour tortillas. Vegetables Fresh or frozen vegetables (raw, steamed, roasted, or grilled). Low-sodium or reduced-sodium tomato and vegetable juice. Low-sodium or reduced-sodium tomato sauce and tomato paste. Low-sodium or reduced-sodium canned vegetables. Fruits All fresh, dried, or frozen fruit. Canned fruit in natural juice (without  added sugar). Meat and other protein foods Skinless chicken or turkey. Ground chicken or turkey. Pork with fat trimmed off. Fish and seafood. Egg whites. Dried beans, peas, or lentils. Unsalted nuts, nut butters, and seeds. Unsalted canned beans. Lean cuts of beef with fat trimmed off. Low-sodium, lean deli meat. Dairy Low-fat (1%) or fat-free (skim) milk. Fat-free, low-fat, or reduced-fat cheeses. Nonfat, low-sodium ricotta or cottage cheese. Low-fat or nonfat yogurt. Low-fat, low-sodium cheese. Fats and oils Soft margarine without trans fats. Vegetable oil. Low-fat, reduced-fat, or light mayonnaise and salad dressings (reduced-sodium). Canola, safflower, olive, soybean, and sunflower oils. Avocado. Seasoning and other foods Herbs. Spices. Seasoning mixes without salt. Unsalted popcorn and pretzels. Fat-free sweets. What foods are not recommended? The items listed may not be a complete list. Talk with your dietitian about what dietary choices are best for you. Grains Baked goods made with fat, such as croissants, muffins, or some breads. Dry pasta or rice meal packs. Vegetables Creamed or fried vegetables. Vegetables in a cheese sauce. Regular canned vegetables (not low-sodium or reduced-sodium). Regular canned tomato sauce and paste (not low-sodium or reduced-sodium). Regular tomato and vegetable juice (not low-sodium or reduced-sodium). Pickles. Olives. Fruits Canned fruit in a light or heavy syrup. Fried fruit. Fruit in cream or butter sauce. Meat and other protein foods Fatty cuts of meat. Ribs. Fried meat. Bacon. Sausage. Bologna and other processed lunch meats. Salami. Fatback. Hotdogs. Bratwurst. Salted nuts and seeds. Canned beans with added salt. Canned or smoked fish. Whole eggs or egg yolks. Chicken or turkey with skin. Dairy Whole or 2% milk, cream, and half-and-half. Whole or full-fat cream cheese. Whole-fat or sweetened yogurt. Full-fat cheese. Nondairy creamers. Whipped toppings.  Processed cheese and cheese spreads. Fats and oils Butter. Stick margarine. Lard. Shortening. Ghee. Bacon fat. Tropical oils, such as coconut, palm kernel, or palm oil. Seasoning and other foods Salted popcorn and pretzels. Onion salt, garlic salt, seasoned salt, table salt, and sea salt. Worcestershire sauce. Tartar sauce. Barbecue sauce. Teriyaki sauce. Soy sauce, including reduced-sodium. Steak sauce. Canned and packaged gravies. Fish sauce. Oyster sauce. Cocktail sauce. Horseradish that you find on the shelf. Ketchup. Mustard. Meat flavorings and tenderizers. Bouillon cubes. Hot sauce and Tabasco sauce. Premade or packaged marinades. Premade or packaged taco seasonings. Relishes. Regular salad dressings. Where to find more information:  National Heart, Lung, and Blood Institute: www.nhlbi.nih.gov  American Heart Association: www.heart.org Summary  The DASH eating plan is a healthy eating plan that has been shown to reduce high blood pressure (hypertension). It may also reduce your risk for type 2 diabetes, heart disease, and stroke.  With the DASH eating plan, you should limit salt (sodium) intake to 2,300 mg a day. If you have hypertension, you may need to reduce your sodium intake to 1,500 mg a day.  When on the DASH eating plan, aim to eat more fresh fruits and vegetables, whole grains, lean proteins, low-fat dairy, and heart-healthy fats.  Work with your health care provider or diet and nutrition specialist (dietitian) to adjust your eating plan to your individual   calorie needs. This information is not intended to replace advice given to you by your health care provider. Make sure you discuss any questions you have with your health care provider. Document Released: 01/12/2011 Document Revised: 01/17/2016 Document Reviewed: 01/17/2016 Elsevier Interactive Patient Education  2017 [employer]  

## 2017-02-16 ENCOUNTER — Ambulatory Visit: Payer: No Typology Code available for payment source | Admitting: Internal Medicine

## 2017-02-16 DIAGNOSIS — Z0289 Encounter for other administrative examinations: Secondary | ICD-10-CM

## 2017-02-16 NOTE — Progress Notes (Deleted)
Subjective:    Patient ID: Douglas Jackson, male    DOB: 01/02/1986, 32 y.o.   MRN: [id number]  HPI  Pt presents to the clinic today to follow up HTN. At his last visit, his Lisinopril was increased to 40 mg daily. He has been taking the medication as prescribed. He denies adverse side effects. His BP today is.  Review of Systems      Past Medical History:  Diagnosis Date  . Anxiety   . Diabetes mellitus without complication (HCC)   . Hypertension     Current Outpatient Medications  Medication Sig Dispense Refill  . B Complex Vitamins (VITAMIN B COMPLEX) TABS Take 1 tablet by mouth 2 (two) times daily.    . busPIRone (BUSPAR) 10 MG tablet Take 1 tablet (10 mg total) by mouth 3 (three) times daily. 90 tablet 0  . glipiZIDE (GLUCOTROL) 5 MG tablet Take 1 tablet (5 mg total) 2 (two) times daily before a meal by mouth. 60 tablet 2  . glucosamine-chondroitin 500-400 MG tablet Take 1 tablet 3 (three) times daily by mouth.    Marland Kitchen. lisinopril (PRINIVIL,ZESTRIL) 40 MG tablet Take 1 tablet (40 mg total) by mouth daily. 30 tablet 0  . meloxicam (MOBIC) 15 MG tablet Take 1 tablet (15 mg total) by mouth daily. 30 tablet 0  . metFORMIN (GLUCOPHAGE) 1000 MG tablet Take 1 tablet (1,000 mg total) by mouth 2 (two) times daily with a meal. 60 tablet 5  . naproxen sodium (ALEVE) 220 MG tablet Take 440 mg by mouth 2 (two) times daily as needed (pain).    . Omega-3 Fatty Acids (FISH OIL) 1000 MG CAPS Take 1,000 mg by mouth 2 (two) times daily.    . sertraline (ZOLOFT) 100 MG tablet Take 1 tablet (100 mg total) by mouth daily. 30 tablet 5   No current facility-administered medications for this visit.     Allergies  Allergen Reactions  . Celestone [Betamethasone] Other (See Comments)    "very angry and combative"   . Prednisone Hives  . Toradol [Ketorolac Tromethamine]     Family History  Problem Relation Age of Onset  . COPD Mother   . Heart disease Mother   . Hypertension Mother   .  Fibromyalgia Mother   . Diabetes Mother   . Drug abuse Father   . Alcohol abuse Father   . Hypertension Sister   . Heart disease Maternal Grandmother   . Diabetes Maternal Grandfather   . Kidney disease Maternal Grandfather     Social History   Socioeconomic History  . Marital status: Single    Spouse name: Not on file  . Number of children: Not on file  . Years of education: GED  . Highest education level: Not on file  Social Needs  . Financial resource strain: Not on file  . Food insecurity - worry: Not on file  . Food insecurity - inability: Not on file  . Transportation needs - medical: Not on file  . Transportation needs - non-medical: Not on file  Occupational History  . 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 Smoking status: Current Every Day Smoker    Packs/day: 0.20    Years: 10.00    Pack years: 2.00  . Smokeless tobacco: Former Neurosurgeon[employer]  . Tobacco comment: Vapes and some smokeless tobacco  Substance and Sexual Activity  . Alcohol use: Yes    Alcohol/week: 0.6 oz  Types: 1 Cans of beer per week    Comment: rare  . Drug use: No  . Sexual activity: Not on file  Other Topics Concern  . Not on file  Social History Narrative  . Not on file     Constitutional: Denies fever, malaise, fatigue, headache or abrupt weight changes.  HEENT: Denies eye pain, eye redness, ear pain, ringing in the ears, wax buildup, runny nose, nasal congestion, bloody nose, or sore throat. Respiratory: Denies difficulty breathing, shortness of breath, cough or sputum production.   Cardiovascular: Denies chest pain, chest tightness, palpitations or swelling in the hands or feet.  Gastrointestinal: Denies abdominal pain, bloating, constipation, diarrhea or blood in the stool.  GU: Denies urgency, frequency, pain with urination, burning sensation, blood in urine, odor or discharge. Musculoskeletal: Denies decrease in range of motion,  difficulty with gait, muscle pain or joint pain and swelling.  Skin: Denies redness, rashes, lesions or ulcercations.  Neurological: Denies dizziness, difficulty with memory, difficulty with speech or problems with balance and coordination.  Psych: Denies anxiety, depression, SI/HI.  No other specific complaints in a complete review of systems (except as listed in HPI above).  Objective:   Physical Exam        Assessment & Plan:

## 2017-09-19 IMAGING — CT CT RENAL STONE PROTOCOL
2 of 4 series · 16 of 46 positions shown, 18 images · non-contrast
Comparison: None.

CLINICAL DATA: Sudden onset right-sided flank pain.

EXAM:
CT ABDOMEN AND PELVIS WITHOUT CONTRAST
TECHNIQUE: Multidetector CT imaging of the abdomen and pelvis was performed
following the standard protocol without IV contrast.

[Series 3: renal stone 5.0 · axial · 0.75mm/px · z∈[+678,+1158]mm · 13 of 106 slices shown, 15 images]
[im 5/106  soft-tissue]
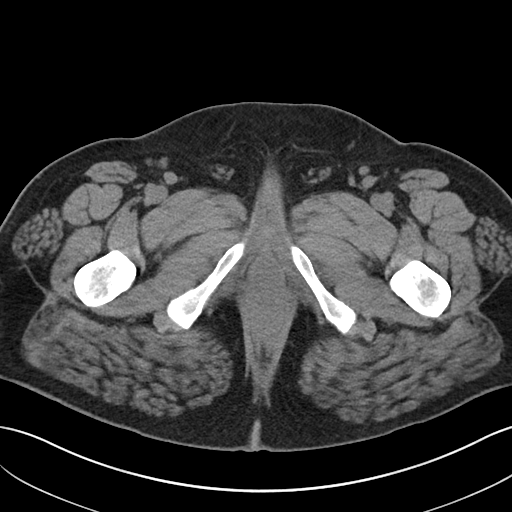
[im 5/106  bone]
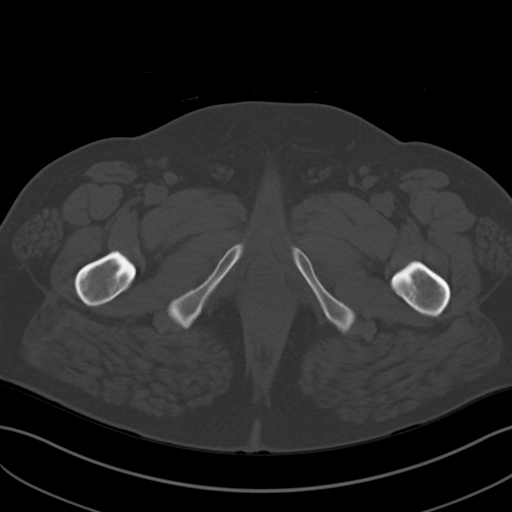
[im 13/106  soft-tissue]
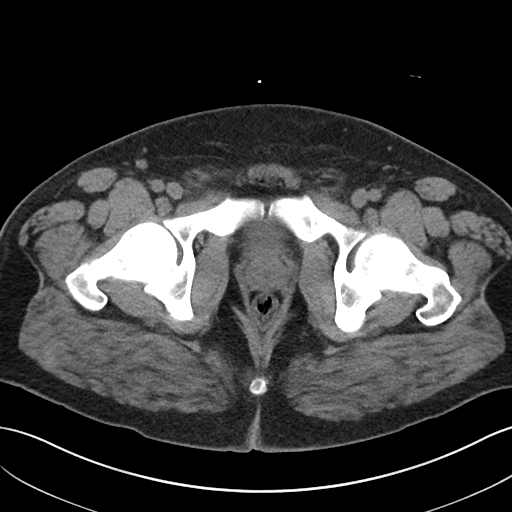
[im 22/106  soft-tissue]
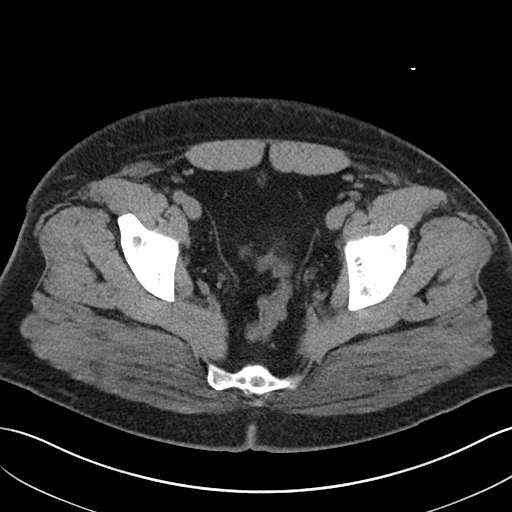
[im 30/106  soft-tissue]
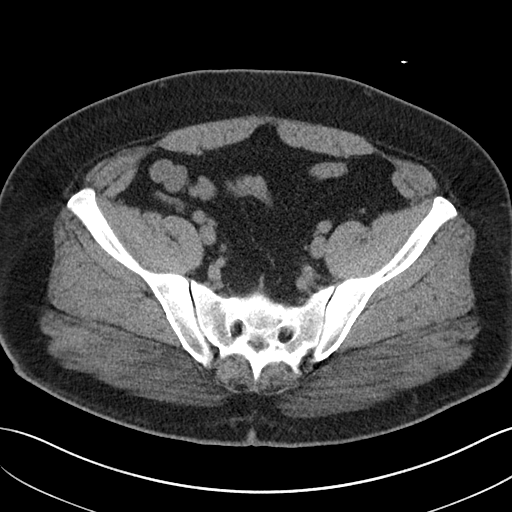
[im 38/106  soft-tissue]
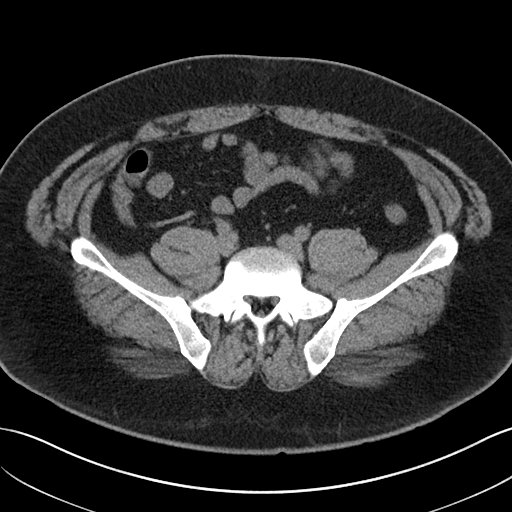
[im 47/106  soft-tissue]
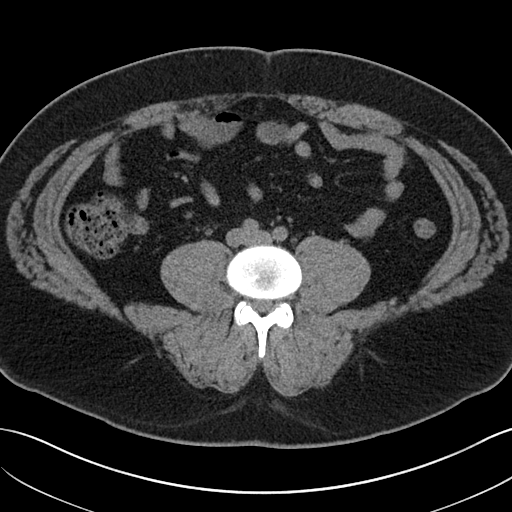
[im 55/106  soft-tissue]
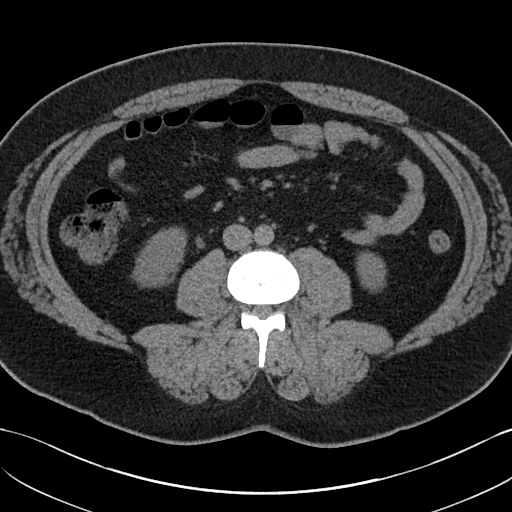
[im 59/106  soft-tissue]
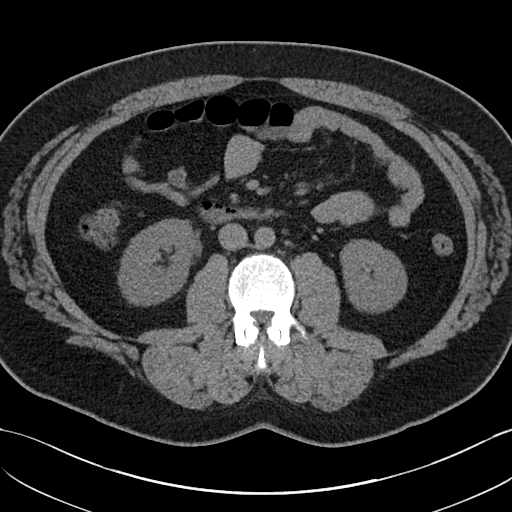
[im 68/106  soft-tissue]
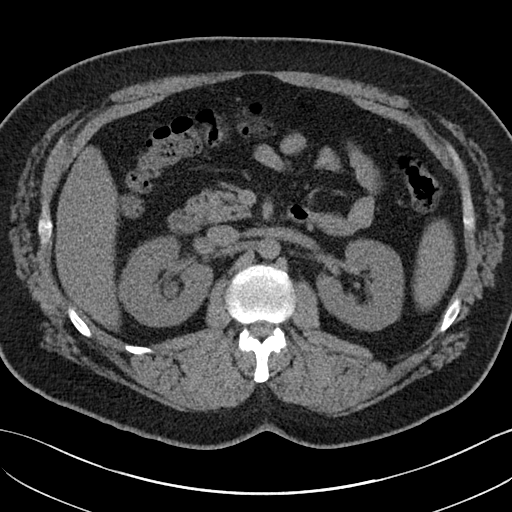
[im 68/106  bone]
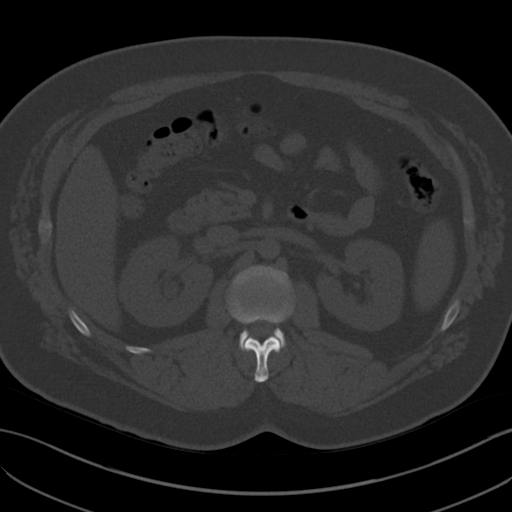
[im 76/106  soft-tissue]
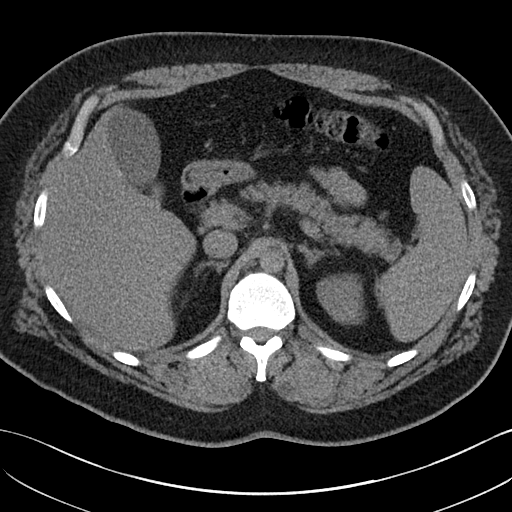
[im 85/106  soft-tissue]
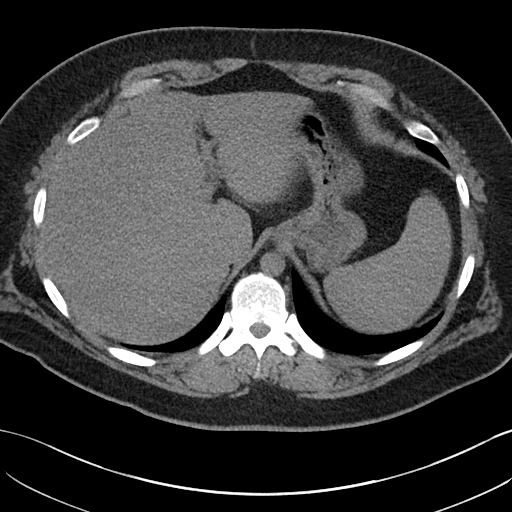
[im 93/106  soft-tissue]
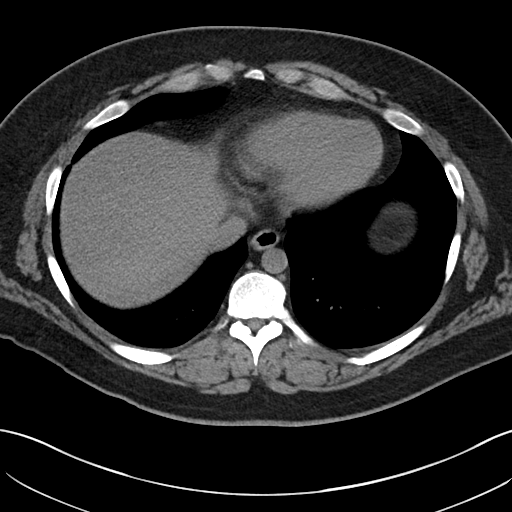
[im 101/106  soft-tissue]
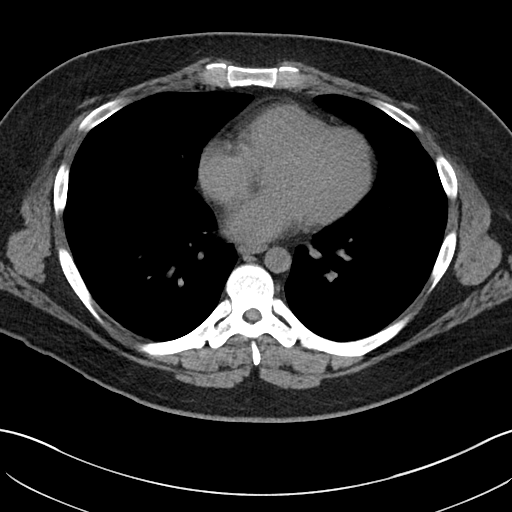

[Series 5: renal stone 3.0 cor · coronal · 0.77mm/px · 3 of 101 slices shown]
[im 34/101  soft-tissue]
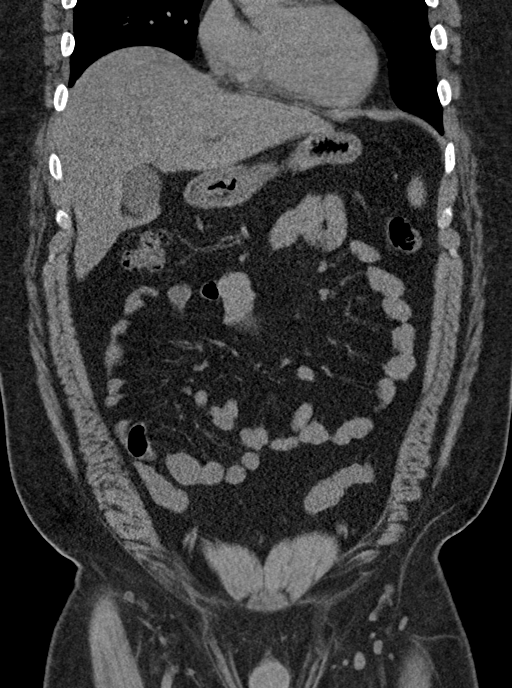
[im 45/101  soft-tissue]
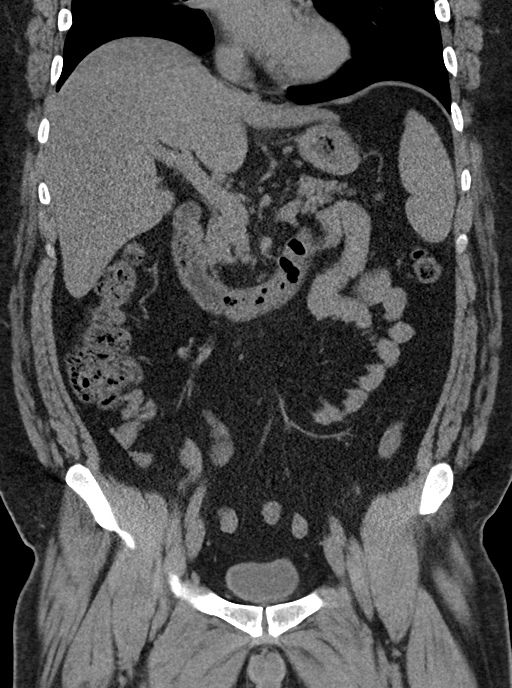
[im 56/101  soft-tissue]
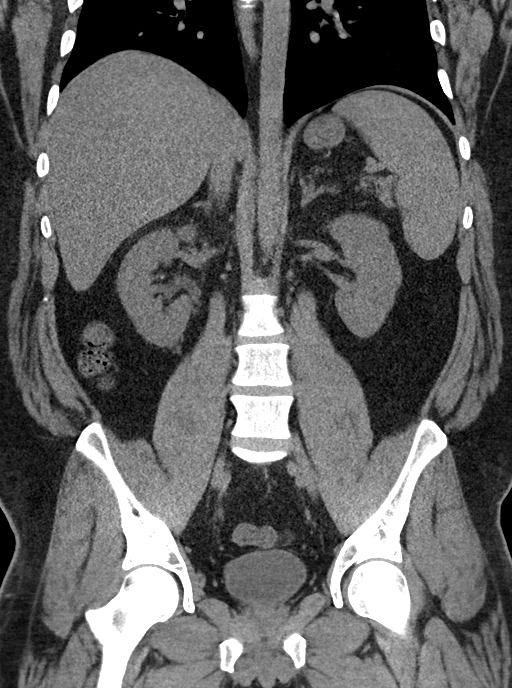

[16 of 46 positions shown; findings below may reference images not displayed]

FINDINGS: Lower chest: Negative.

Hepatobiliary: Liver and gallbladder are unremarkable. No biliary
ductal dilatation.

Pancreas: Negative.

Spleen: Negative.

Adrenals/Urinary Tract: Adrenal glands are unremarkable. Mild right
renal edema and mild right hydronephrosis secondary to a 1-2 mm
stone in the bladder, at the right ureteral orifice. No additional
urinary stones. Kidneys are otherwise unremarkable. Left ureter is
decompressed. Bladder is otherwise unremarkable.

Stomach/Bowel: Stomach, small bowel, appendix and colon are
unremarkable.

Vascular/Lymphatic: Vascular structures are unremarkable. Scattered
lymph nodes are not enlarged by CT size criteria.

Reproductive: Prostate is normal in size.

Other: No free fluid.  Mesenteries and peritoneum are unremarkable.

Musculoskeletal: Negative.
IMPRESSION: Mild right hydronephrosis secondary to a 1-2 mm stone in the
bladder, at the right ureteral orifice.

## 2022-02-21 DIAGNOSIS — E785 Hyperlipidemia, unspecified: Secondary | ICD-10-CM | POA: Diagnosis not present

## 2022-02-21 DIAGNOSIS — I10 Essential (primary) hypertension: Secondary | ICD-10-CM | POA: Diagnosis not present

## 2022-02-21 DIAGNOSIS — Z0000 Encounter for general adult medical examination without abnormal findings: Secondary | ICD-10-CM | POA: Diagnosis not present

## 2022-02-21 DIAGNOSIS — E119 Type 2 diabetes mellitus without complications: Secondary | ICD-10-CM | POA: Diagnosis not present

## 2022-02-21 DIAGNOSIS — R5383 Other fatigue: Secondary | ICD-10-CM | POA: Diagnosis not present

## 2022-02-21 DIAGNOSIS — R062 Wheezing: Secondary | ICD-10-CM | POA: Diagnosis not present

## 2022-02-21 DIAGNOSIS — R0681 Apnea, not elsewhere classified: Secondary | ICD-10-CM | POA: Diagnosis not present

## 2022-03-21 DIAGNOSIS — E119 Type 2 diabetes mellitus without complications: Secondary | ICD-10-CM | POA: Diagnosis not present

## 2022-03-21 DIAGNOSIS — E559 Vitamin D deficiency, unspecified: Secondary | ICD-10-CM | POA: Diagnosis not present

## 2022-03-21 DIAGNOSIS — R0681 Apnea, not elsewhere classified: Secondary | ICD-10-CM | POA: Diagnosis not present

## 2022-03-21 DIAGNOSIS — R062 Wheezing: Secondary | ICD-10-CM | POA: Diagnosis not present

## 2022-07-27 DIAGNOSIS — R0681 Apnea, not elsewhere classified: Secondary | ICD-10-CM | POA: Diagnosis not present

## 2022-07-27 DIAGNOSIS — E785 Hyperlipidemia, unspecified: Secondary | ICD-10-CM | POA: Diagnosis not present

## 2022-07-27 DIAGNOSIS — E559 Vitamin D deficiency, unspecified: Secondary | ICD-10-CM | POA: Diagnosis not present

## 2022-07-27 DIAGNOSIS — E119 Type 2 diabetes mellitus without complications: Secondary | ICD-10-CM | POA: Diagnosis not present

## 2022-07-27 DIAGNOSIS — M79676 Pain in unspecified toe(s): Secondary | ICD-10-CM | POA: Diagnosis not present

## 2022-08-07 DIAGNOSIS — S99922A Unspecified injury of left foot, initial encounter: Secondary | ICD-10-CM | POA: Diagnosis not present

## 2022-08-07 DIAGNOSIS — Z888 Allergy status to other drugs, medicaments and biological substances status: Secondary | ICD-10-CM | POA: Diagnosis not present

## 2022-08-07 DIAGNOSIS — Z87891 Personal history of nicotine dependence: Secondary | ICD-10-CM | POA: Diagnosis not present

## 2022-08-07 DIAGNOSIS — Z7984 Long term (current) use of oral hypoglycemic drugs: Secondary | ICD-10-CM | POA: Diagnosis not present

## 2022-08-07 DIAGNOSIS — E119 Type 2 diabetes mellitus without complications: Secondary | ICD-10-CM | POA: Diagnosis not present

## 2022-08-07 DIAGNOSIS — Z79899 Other long term (current) drug therapy: Secondary | ICD-10-CM | POA: Diagnosis not present

## 2022-08-07 DIAGNOSIS — L02612 Cutaneous abscess of left foot: Secondary | ICD-10-CM | POA: Diagnosis not present

## 2022-08-07 DIAGNOSIS — L03032 Cellulitis of left toe: Secondary | ICD-10-CM | POA: Diagnosis not present

## 2022-08-07 DIAGNOSIS — I10 Essential (primary) hypertension: Secondary | ICD-10-CM | POA: Diagnosis not present

## 2022-08-07 DIAGNOSIS — M79675 Pain in left toe(s): Secondary | ICD-10-CM | POA: Diagnosis not present

## 2022-08-07 DIAGNOSIS — F419 Anxiety disorder, unspecified: Secondary | ICD-10-CM | POA: Diagnosis not present

## 2022-09-06 DIAGNOSIS — K760 Fatty (change of) liver, not elsewhere classified: Secondary | ICD-10-CM | POA: Diagnosis not present

## 2022-09-06 DIAGNOSIS — R1011 Right upper quadrant pain: Secondary | ICD-10-CM | POA: Diagnosis not present

## 2023-11-01 ENCOUNTER — Encounter: Payer: Self-pay | Admitting: Adult Health

## 2023-11-01 ENCOUNTER — Ambulatory Visit: Admitting: Adult Health

## 2023-11-01 VITALS — BP 154/100 | HR 84 | Temp 98.6°F | Ht 69.0 in | Wt 253.4 lb

## 2023-11-01 DIAGNOSIS — E11628 Type 2 diabetes mellitus with other skin complications: Secondary | ICD-10-CM

## 2023-11-01 DIAGNOSIS — F332 Major depressive disorder, recurrent severe without psychotic features: Secondary | ICD-10-CM

## 2023-11-01 DIAGNOSIS — E66812 Obesity, class 2: Secondary | ICD-10-CM

## 2023-11-01 DIAGNOSIS — R0683 Snoring: Secondary | ICD-10-CM

## 2023-11-01 DIAGNOSIS — F900 Attention-deficit hyperactivity disorder, predominantly inattentive type: Secondary | ICD-10-CM

## 2023-11-01 DIAGNOSIS — Z6837 Body mass index (BMI) 37.0-37.9, adult: Secondary | ICD-10-CM

## 2023-11-01 DIAGNOSIS — Z113 Encounter for screening for infections with a predominantly sexual mode of transmission: Secondary | ICD-10-CM | POA: Diagnosis not present

## 2023-11-01 DIAGNOSIS — I10 Essential (primary) hypertension: Secondary | ICD-10-CM | POA: Diagnosis not present

## 2023-11-01 DIAGNOSIS — Z7689 Persons encountering health services in other specified circumstances: Secondary | ICD-10-CM

## 2023-11-01 MED ORDER — CLONIDINE HCL 0.1 MG PO TABS
0.1000 mg | ORAL_TABLET | ORAL | Status: AC
  Administered 2023-11-01: 0.1 mg via ORAL

## 2023-11-01 MED ORDER — OZEMPIC (0.25 OR 0.5 MG/DOSE) 2 MG/1.5ML ~~LOC~~ SOPN
0.5000 mg | PEN_INJECTOR | SUBCUTANEOUS | 1 refills | Status: AC
Start: 2023-11-01 — End: ?

## 2023-11-01 MED ORDER — BUPROPION HCL ER (XL) 300 MG PO TB24: 300.0000 mg | ORAL_TABLET | Freq: Every day | ORAL | 1 refills | Status: AC

## 2023-11-01 MED ORDER — AMLODIPINE BESYLATE 10 MG PO TABS: 10.0000 mg | ORAL_TABLET | Freq: Every day | ORAL | 1 refills | Status: AC

## 2023-11-01 MED ORDER — OLMESARTAN MEDOXOMIL 40 MG PO TABS: 40.0000 mg | ORAL_TABLET | Freq: Every day | ORAL | 1 refills | Status: AC

## 2023-11-01 MED ORDER — CITALOPRAM HYDROBROMIDE 40 MG PO TABS: 40.0000 mg | ORAL_TABLET | Freq: Every day | ORAL | 1 refills | Status: DC

## 2023-11-01 MED ORDER — BUPROPION HCL ER (XL) 150 MG PO TB24: 150.0000 mg | ORAL_TABLET | Freq: Every day | ORAL | 1 refills | Status: AC

## 2023-11-01 NOTE — Progress Notes (Signed)
 [facility] clinic  Provider:  Jereld Serum DNP  Code Status:  Full Code  Goals of Care:     05/27/2016   11:24 AM  Advanced Directives  Does Patient Have a Medical Advance Directive? No      Data saved with a previous flowsheet row definition     Chief Complaint  Patient presents with   New Patient (Initial Visit)   PHQ-9 8 Week Follow-up   Discussed the use of AI scribe software for clinical note transcription with the patient, who gave verbal consent to proceed.  HPI: Patient is a 38 y.o. male seen today to establish care with [facility].  He has a history of hypertension diagnosed at age 50 and has been on various medications including amlodipine  10 mg daily and olmesartan  40 mg daily. Due to a lapse in insurance coverage, he has not been taking his blood pressure medications for the past three months, resulting in uncontrolled hypertension with recent readings of 160/100 mmHg. No headaches or other acute symptoms.  He has type 2 diabetes, diagnosed at age 78, with a last noted A1c of 5.8% done 6 years ago. He has not been on any diabetic medications for the past three months due to insurance issues. Previously, he was on metformin  1000 mg twice daily and Ozempic  0.5 mg weekly. He reports a weight gain of approximately 40 pounds since discontinuing Ozempic .  He experiences symptoms of moderate depression, with a PHQ-9 score of 15, and has been on Wellbutrin  450 mg daily and citalopram  40 mg daily. He has not been able to refill these medications due to insurance changes. He reports stress related to being the sole income provider and managing his health conditions.  He has a history of ADHD diagnosed at age 67 and was previously on dexedrine until fifth grade. He currently takes Wellbutrin , which helps to some extent, but he still struggles with focus and concentration.  He reports symptoms suggestive of sleep apnea, including loud snoring and waking up gasping for air. He has not  undergone a sleep study.  He has a history of dyslexia and completed his education with a GED. He works as a Publishing rights manager and is physically active at work and home, chasing his two young children.  He quit smoking over two years ago and does not consume alcohol or use recreational drugs. No headaches, shortness of breath, or chest pain. He experiences numbness and tingling in his feet, consistent with diabetic neuropathy, and has a history of recurrent kidney stones.    Past Medical History:  Diagnosis Date   Anxiety    Diabetes mellitus without complication (HCC)    Hypertension     Past Surgical History:  Procedure Laterality Date   MOUTH SURGERY      Allergies  Allergen Reactions   Celestone [Betamethasone] Other (See Comments)    very angry and combative    Prednisone Hives   Toradol  [Ketorolac  Tromethamine ]     Outpatient Encounter Medications as of 11/01/2023  Medication Sig   metFORMIN  (GLUCOPHAGE ) 1000 MG tablet Take 1 tablet (1,000 mg total) by mouth 2 (two) times daily with a meal.   Semaglutide ,0.25 or 0.5MG /DOS, (OZEMPIC , 0.25 OR 0.5 MG/DOSE,) 2 MG/1.5ML SOPN Inject 0.5 mg into the skin once a week.   [DISCONTINUED] amLODipine  (NORVASC ) 10 MG tablet Take 10 mg by mouth daily.   [DISCONTINUED] buPROPion  (WELLBUTRIN  XL) 150 MG 24 hr tablet Take 150 mg by mouth daily.   [DISCONTINUED] buPROPion  (WELLBUTRIN  XL)  300 MG 24 hr tablet Take 300 mg by mouth daily.   [DISCONTINUED] citalopram  (CELEXA ) 40 MG tablet Take 40 mg by mouth daily.   [DISCONTINUED] glipiZIDE  (GLUCOTROL ) 5 MG tablet Take 1 tablet (5 mg total) 2 (two) times daily before a meal by mouth.   [DISCONTINUED] naproxen  sodium (ALEVE ) 220 MG tablet Take 440 mg by mouth 2 (two) times daily as needed (pain).   [DISCONTINUED] olmesartan  (BENICAR ) 40 MG tablet Take 40 mg by mouth daily.   [DISCONTINUED] Semaglutide , 1 MG/DOSE, (OZEMPIC , 1 MG/DOSE,) 2 MG/1.5ML SOPN Inject into the skin.   amLODipine   (NORVASC ) 10 MG tablet Take 1 tablet (10 mg total) by mouth daily.   buPROPion  (WELLBUTRIN  XL) 150 MG 24 hr tablet Take 1 tablet (150 mg total) by mouth daily.   buPROPion  (WELLBUTRIN  XL) 300 MG 24 hr tablet Take 1 tablet (300 mg total) by mouth daily.   citalopram  (CELEXA ) 40 MG tablet Take 1 tablet (40 mg total) by mouth daily.   glucosamine-chondroitin 500-400 MG tablet Take 1 tablet 3 (three) times daily by mouth. (Patient not taking: Reported on 11/01/2023)   olmesartan  (BENICAR ) 40 MG tablet Take 1 tablet (40 mg total) by mouth daily.   [DISCONTINUED] B Complex Vitamins (VITAMIN B COMPLEX) TABS Take 1 tablet by mouth 2 (two) times daily. (Patient not taking: Reported on 11/01/2023)   [DISCONTINUED] busPIRone  (BUSPAR ) 10 MG tablet Take 1 tablet (10 mg total) by mouth 3 (three) times daily. (Patient not taking: Reported on 11/01/2023)   [DISCONTINUED] lisinopril  (PRINIVIL ,ZESTRIL ) 40 MG tablet Take 1 tablet (40 mg total) by mouth daily.   [DISCONTINUED] Omega-3 Fatty Acids (FISH OIL) 1000 MG CAPS Take 1,000 mg by mouth 2 (two) times daily. (Patient not taking: Reported on 11/01/2023)   [DISCONTINUED] sertraline  (ZOLOFT ) 100 MG tablet Take 1 tablet (100 mg total) by mouth daily. (Patient not taking: Reported on 11/01/2023)   [EXPIRED] cloNIDine  (CATAPRES ) tablet 0.1 mg    No facility-administered encounter medications on file as of 11/01/2023.    Review of Systems:  Review of Systems  Constitutional:  Negative for activity change, appetite change and fever.  HENT:  Negative for sore throat.   Eyes: Negative.   Cardiovascular:  Negative for chest pain and leg swelling.  Gastrointestinal:  Negative for abdominal distention, diarrhea and vomiting.  Genitourinary:  Negative for dysuria, frequency and urgency.  Skin:  Negative for color change.  Neurological:  Negative for dizziness and headaches.  Psychiatric/Behavioral:  Negative for behavioral problems and sleep disturbance. The patient is not  nervous/anxious.     Health Maintenance  Topic Date Due   OPHTHALMOLOGY EXAM  Never done   Diabetic kidney evaluation - Urine ACR  Never done   DTaP/Tdap/Td (1 - Tdap) Never done   Hepatitis B Vaccines 19-59 Average Risk (1 of 3 - 19+ 3-dose series) Never done   HPV VACCINES (1 - 3-dose SCDM series) Never done   COVID-19 Vaccine (1 - 2024-25 season) 11/17/2023 (Originally 10/08/2023)   Influenza Vaccine  05/06/2024 (Originally 09/07/2023)   Pneumococcal Vaccine (1 of 2 - PCV) 10/31/2024 (Originally 09/07/2006)   HEMOGLOBIN A1C  04/30/2024   Diabetic kidney evaluation - eGFR measurement  10/31/2024   FOOT EXAM  10/31/2024   Hepatitis C Screening  Completed   HIV Screening  Completed   Meningococcal B Vaccine  Aged Out    Physical Exam: Vitals:   11/01/23 1510 11/01/23 1538 11/01/23 1614 11/01/23 1641  BP: (!) 160/100 (!) 158/102 (!) 160/100 ROLLEN)  154/100  Pulse: 84     Temp: 98.6 F (37 C)     SpO2: 98%     Weight: 253 lb 6.4 oz (114.9 kg)     Height: 5' 9 (1.753 m)      Body mass index is 37.42 kg/m. Physical Exam Constitutional:      General: He is not in acute distress.    Appearance: He is obese.  HENT:     Head: Normocephalic and atraumatic.     Mouth/Throat:     Mouth: Mucous membranes are moist.  Eyes:     Conjunctiva/sclera: Conjunctivae normal.  Cardiovascular:     Rate and Rhythm: Normal rate and regular rhythm.     Pulses: Normal pulses.     Heart sounds: Normal heart sounds.  Pulmonary:     Effort: Pulmonary effort is normal.     Breath sounds: Normal breath sounds.  Abdominal:     General: Bowel sounds are normal.     Palpations: Abdomen is soft.  Musculoskeletal:        General: No swelling. Normal range of motion.     Cervical back: Normal range of motion.  Skin:    General: Skin is warm and dry.  Neurological:     General: No focal deficit present.     Mental Status: He is alert and oriented to person, place, and time.  Psychiatric:        Mood  and Affect: Mood normal.        Behavior: Behavior normal.        Thought Content: Thought content normal.        Judgment: Judgment normal.     Labs reviewed: Basic Metabolic Panel: Recent Labs    11/01/23 1614  NA 140  K 4.0  CL 104  CO2 30  GLUCOSE 112*  BUN 13  CREATININE 0.94  CALCIUM 9.1   Liver Function Tests: Recent Labs    11/01/23 1614  AST 37  ALT 63*  BILITOT 0.3  PROT 7.4   No results for input(s): LIPASE, AMYLASE in the last 8760 hours. No results for input(s): AMMONIA in the last 8760 hours. CBC: Recent Labs    11/01/23 1614  WBC 12.2*  NEUTROABS 7,491  HGB 14.8  HCT 43.7  MCV 85.2  PLT 308   Lipid Panel: Recent Labs    11/01/23 1614  CHOL 164  HDL 45  LDLCALC 95  TRIG 144  CHOLHDL 3.6   Lab Results  Component Value Date   HGBA1C 6.6 (H) 11/01/2023    Procedures since last visit: No results found.  Assessment/Plan  1. Uncontrolled hypertension (Primary) -  Hypertension uncontrolled with BP 160/100 mmHg. No current medication due to insurance issues. - Administer clonidine  0.1 mg in office. - Prescribe amlodipine  10 mg daily. - Prescribe olmesartan  40 mg daily. - Advise home BP monitoring. - cloNIDine  (CATAPRES ) tablet 0.1 mg - amLODipine  (NORVASC ) 10 MG tablet; Take 1 tablet (10 mg total) by mouth daily.  Dispense: 90 tablet; Refill: 1 - olmesartan  (BENICAR ) 40 MG tablet; Take 1 tablet (40 mg total) by mouth daily.  Dispense: 90 tablet; Refill: 1 - Lipid panel - CBC with Differential/Platelets - Comprehensive metabolic panel  2. Attention deficit hyperactivity disorder (ADHD), predominantly inattentive type -  managed with Wellbutrin . Reports difficulty focusing. - Prescribe Wellbutrin  450 mg daily. - Refer to psychiatry. - Ambulatory referral to Psychiatry - CBC with Differential/Platelets - buPROPion  (WELLBUTRIN  XL) 150 MG 24 hr tablet; Take  1 tablet (150 mg total) by mouth daily.  Dispense: 90 tablet; Refill:  1 - buPROPion  (WELLBUTRIN  XL) 300 MG 24 hr tablet; Take 1 tablet (300 mg total) by mouth daily.  Dispense: 90 tablet; Refill: 1  3. Moderately severe recurrent major depression (HCC) -  Moderate to moderately severe depression, PHQ-9 score 15. Reports stress due to single income and medication access issues. - Continue Wellbutrin  450 mg daily. - Continue citalopram  40 mg daily. - Refer to psychiatry. - CBC with Differential/Platelets - citalopram  (CELEXA ) 40 MG tablet; Take 1 tablet (40 mg total) by mouth daily.  Dispense: 90 tablet; Refill: 1  4. Type 2 diabetes mellitus with other skin complication, without long-term current use of insulin (HCC) -  with neuropathy, A1c 5.8% 6 years ago. No medication due to insurance issues. Reports weight gain and foot numbness. - Start on metformin  1000 mg twice daily. - On follow-up with Ozempic  0.5 mg weekly. - Order A1c test. - Order lipid panel. - Order CBC. - Refer to ophthalmology for retinopathy screening. - Advise on foot care. - Semaglutide ,0.25 or 0.5MG /DOS, (OZEMPIC , 0.25 OR 0.5 MG/DOSE,) 2 MG/1.5ML SOPN; Inject 0.5 mg into the skin once a week.  Dispense: 4.5 mL; Refill: 1 - Hemoglobin A1C - Lipid panel - CBC with Differential/Platelets - Ambulatory referral to Ophthalmology - Microalbumin, urine  5. Class 2 obesity with body mass index (BMI) of 37.0 to 37.9 in adult, unspecified obesity type, unspecified whether serious comorbidity present -  BMI 37.42, weight gain despite physical activity. - Prescribe Ozempic  0.5 mg weekly. - Encourage regular physical activity. - Discuss weight management benefits. - Semaglutide ,0.25 or 0.5MG /DOS, (OZEMPIC , 0.25 OR 0.5 MG/DOSE,) 2 MG/1.5ML SOPN; Inject 0.5 mg into the skin once a week.  Dispense: 4.5 mL; Refill: 1  6. Snores -  Reports snoring and apneic episodes. No prior sleep study. - Order sleep study. - Ambulatory referral to Sleep Studies  7. Screen for STD (sexually transmitted  disease) - Hep C Antibody - HIV antibody (with reflex)  8. Encounter to establish care -  established care with [facility]    General Health Maintenance Routine health maintenance discussed. - Order hepatitis C and STD screening. - Advise regular dental check-ups. - Encourage smoking cessation and avoidance of secondhand smoke.     Labs/tests ordered:   Urine albumin creatinine ratio, HIV and hep C antibody, A1c, CMP, CBC   Return in about 1 week (around 11/08/2023).  Fernie Grimm Medina-Vargas, NP

## 2023-11-01 NOTE — Patient Instructions (Signed)
 Preventive Care 11-36 Years Old, Male Preventive care refers to lifestyle choices and visits with your health care provider that can promote health and wellness. Preventive care visits are also called wellness exams. What can I expect for my preventive care visit? Counseling During your preventive care visit, your health care provider may ask about your: Medical history, including: Past medical problems. Family medical history. Current health, including: Emotional well-being. Home life and relationship well-being. Sexual activity. Lifestyle, including: Alcohol, nicotine or tobacco, and drug use. Access to firearms. Diet, exercise, and sleep habits. Safety issues such as seatbelt and bike helmet use. Sunscreen use. Work and work Astronomer. Physical exam Your health care provider may check your: Height and weight. These may be used to calculate your BMI (body mass index). BMI is a measurement that tells if you are at a healthy weight. Waist circumference. This measures the distance around your waistline. This measurement also tells if you are at a healthy weight and may help predict your risk of certain diseases, such as type 2 diabetes and high blood pressure. Heart rate and blood pressure. Body temperature. Skin for abnormal spots. What immunizations do I need?  Vaccines are usually given at various ages, according to a schedule. Your health care provider will recommend vaccines for you based on your age, medical history, and lifestyle or other factors, such as travel or where you work. What tests do I need? Screening Your health care provider may recommend screening tests for certain conditions. This may include: Lipid and cholesterol levels. Diabetes screening. This is done by checking your blood sugar (glucose) after you have not eaten for a while (fasting). Hepatitis B test. Hepatitis C test. HIV (human immunodeficiency virus) test. STI (sexually transmitted infection)  testing, if you are at risk. Talk with your health care provider about your test results, treatment options, and if necessary, the need for more tests. Follow these instructions at home: Eating and drinking  Eat a healthy diet that includes fresh fruits and vegetables, whole grains, lean protein, and low-fat dairy products. Drink enough fluid to keep your urine pale yellow. Take vitamin and mineral supplements as recommended by your health care provider. Do not drink alcohol if your health care provider tells you not to drink. If you drink alcohol: Limit how much you have to 0-2 drinks a day. Know how much alcohol is in your drink. In the [place] one drink equals one 12 oz bottle of beer (355 mL), one 5 oz glass of wine (148 mL), or one 1 oz glass of hard liquor (44 mL). Lifestyle Brush your teeth every morning and night with fluoride toothpaste. Floss one time each day. Exercise for at least 30 minutes 5 or more days each week. Do not use any products that contain nicotine or tobacco. These products include cigarettes, chewing tobacco, and vaping devices, such as e-cigarettes. If you need help quitting, ask your health care provider. Do not use drugs. If you are sexually active, practice safe sex. Use a condom or other form of protection to prevent STIs. Find healthy ways to manage stress, such as: Meditation, yoga, or listening to music. Journaling. Talking to a trusted person. Spending time with friends and family. Minimize exposure to UV radiation to reduce your risk of skin cancer. Safety Always wear your seat belt while driving or riding in a vehicle. Do not drive: If you have been drinking alcohol. Do not ride with someone who has been drinking. If you have been using any mind-altering substances  or drugs. While texting. When you are tired or distracted. Wear a helmet and other protective equipment during sports activities. If you have firearms in your house, make sure you  follow all gun safety procedures. Seek help if you have been physically or sexually abused. What's next? Go to your health care provider once a year for an annual wellness visit. Ask your health care provider how often you should have your eyes and teeth checked. Stay up to date on all vaccines. This information is not intended to replace advice given to you by your health care provider. Make sure you discuss any questions you have with your health care provider. Document Revised: 07/21/2020 Document Reviewed: 07/21/2020 Elsevier Patient Education  2024 [employer].

## 2023-11-02 LAB — CBC WITH DIFFERENTIAL/PLATELET
Absolute Lymphocytes: 3855 {cells}/uL (ref 850–3900)
Absolute Monocytes: 695 {cells}/uL (ref 200–950)
Basophils Absolute: 61 {cells}/uL (ref 0–200)
Basophils Relative: 0.5 %
Eosinophils Absolute: 98 {cells}/uL (ref 15–500)
Eosinophils Relative: 0.8 %
HCT: 43.7 % (ref 38.5–50.0)
Hemoglobin: 14.8 g/dL (ref 13.2–17.1)
MCH: 28.8 pg (ref 27.0–33.0)
MCHC: 33.9 g/dL (ref 32.0–36.0)
MCV: 85.2 fL (ref 80.0–100.0)
MPV: 10.5 fL (ref 7.5–12.5)
Monocytes Relative: 5.7 %
Neutro Abs: 7491 {cells}/uL (ref 1500–7800)
Neutrophils Relative %: 61.4 %
Platelets: 308 Thousand/uL (ref 140–400)
RBC: 5.13 Million/uL (ref 4.20–5.80)
RDW: 12.7 % (ref 11.0–15.0)
Total Lymphocyte: 31.6 %
WBC: 12.2 Thousand/uL — ABNORMAL HIGH (ref 3.8–10.8)

## 2023-11-02 LAB — COMPREHENSIVE METABOLIC PANEL WITH GFR
AG Ratio: 1.5 (calc) (ref 1.0–2.5)
ALT: 63 U/L — ABNORMAL HIGH (ref 9–46)
AST: 37 U/L (ref 10–40)
Albumin: 4.4 g/dL (ref 3.6–5.1)
Alkaline phosphatase (APISO): 110 U/L (ref 36–130)
BUN: 13 mg/dL (ref 7–25)
CO2: 30 mmol/L (ref 20–32)
Calcium: 9.1 mg/dL (ref 8.6–10.3)
Chloride: 104 mmol/L (ref 98–110)
Creat: 0.94 mg/dL (ref 0.60–1.26)
Globulin: 3 g/dL (ref 1.9–3.7)
Glucose, Bld: 112 mg/dL — ABNORMAL HIGH (ref 65–99)
Potassium: 4 mmol/L (ref 3.5–5.3)
Sodium: 140 mmol/L (ref 135–146)
Total Bilirubin: 0.3 mg/dL (ref 0.2–1.2)
Total Protein: 7.4 g/dL (ref 6.1–8.1)
eGFR: 108 mL/min/1.73m2 (ref >=60)

## 2023-11-02 LAB — LIPID PANEL
Cholesterol: 164 mg/dL (ref <200)
HDL: 45 mg/dL (ref >=40)
LDL Cholesterol (Calc): 95 mg/dL
Non-HDL Cholesterol (Calc): 119 mg/dL (ref <130)
Total CHOL/HDL Ratio: 3.6 (calc) (ref <5.0)
Triglycerides: 144 mg/dL (ref <150)

## 2023-11-02 LAB — HEMOGLOBIN A1C
Hgb A1c MFr Bld: 6.6 % — ABNORMAL HIGH (ref <5.7)
Mean Plasma Glucose: 143 mg/dL
eAG (mmol/L): 7.9 mmol/L

## 2023-11-02 LAB — MICROALBUMIN, URINE: Microalb, Ur: 1 mg/dL

## 2023-11-02 LAB — HIV ANTIBODY (ROUTINE TESTING W REFLEX)
HIV 1&2 Ab, 4th Generation: NONREACTIVE
HIV FINAL INTERPRETATION: NEGATIVE

## 2023-11-02 LAB — HEPATITIS C ANTIBODY: Hepatitis C Ab: NONREACTIVE

## 2023-11-05 ENCOUNTER — Ambulatory Visit: Payer: Self-pay | Admitting: Adult Health

## 2023-11-05 NOTE — Progress Notes (Signed)
-     Lipid panel and urine microalbumin creatinine ratio, all normal -  ALT elevated, will repeat on follow-up -   WBC 12.2, elevated, will repeat and follow-up -   A1c 6.6, continue metformin  -  HIV and hep C antibody negative

## 2023-11-06 ENCOUNTER — Telehealth: Payer: Self-pay

## 2023-11-06 NOTE — Telephone Encounter (Signed)
 Incoming fax received from pharmacy with the [facility] Prior Authorization Forms for Ozempic .   Forms completed, stamped, attached recent labs, and faxed back to [phone]  Awaiting response from insurance company

## 2023-11-08 NOTE — Telephone Encounter (Signed)
 Reviewed and signed paper and handed to [employer] Research officer, political party).

## 2023-11-08 NOTE — Telephone Encounter (Signed)
 Incoming fax received from [facility] (form) requesting additional clinical information. Form filled in, given to Medina-Vargas, Monina C, NP to review, sign, and then to the admin staff to fax (attached is recent labs and last OV note dated 11/01/23)

## 2023-11-09 NOTE — Telephone Encounter (Signed)
 Incoming fax received from [facility] with approval notification for Ozempic  (see media)

## 2023-11-12 ENCOUNTER — Ambulatory Visit: Admitting: Adult Health

## 2023-11-21 ENCOUNTER — Ambulatory Visit: Admitting: Adult Health

## 2023-11-21 VITALS — BP 138/90 | HR 72 | Temp 98.5°F | Ht 69.0 in | Wt 250.6 lb

## 2023-11-21 DIAGNOSIS — F900 Attention-deficit hyperactivity disorder, predominantly inattentive type: Secondary | ICD-10-CM

## 2023-11-21 DIAGNOSIS — R2232 Localized swelling, mass and lump, left upper limb: Secondary | ICD-10-CM | POA: Diagnosis not present

## 2023-11-21 DIAGNOSIS — F332 Major depressive disorder, recurrent severe without psychotic features: Secondary | ICD-10-CM

## 2023-11-21 DIAGNOSIS — E11628 Type 2 diabetes mellitus with other skin complications: Secondary | ICD-10-CM

## 2023-11-21 DIAGNOSIS — I10 Essential (primary) hypertension: Secondary | ICD-10-CM | POA: Diagnosis not present

## 2023-11-21 MED ORDER — CHLORTHALIDONE 25 MG PO TABS: 12.5000 mg | ORAL_TABLET | Freq: Every day | ORAL | 1 refills | Status: AC

## 2023-11-21 NOTE — Progress Notes (Signed)
 [facility] clinic  Provider:  Jereld Serum DNP  Code Status:  Full Code  Goals of Care:     05/27/2016   11:24 AM  Advanced Directives  Does Patient Have a Medical Advance Directive? No      Data saved with a previous flowsheet row definition     Chief Complaint  Patient presents with   Follow-up    Patient has concerns about knot on shoulder on left side   Discussed the use of AI scribe software for clinical note transcription with the patient, who gave verbal consent to proceed.  HPI: Patient is a 38 y.o. male seen today for an acute visit for a knot on left shoulder.  He has had a mass on his left shoulder for at least five years, described as a 'knot' that has gradually increased in size to approximately 4 cm by 3 cm. No pain, leakage, or previous injury to the area. No prior imaging or evaluation by an orthopedic specialist.  He experiences pain in his right elbow, described as a sensation that makes him feel like he has to drop objects when picking them up. No specific injury to the elbow is mentioned.  He has a history of fatty liver disease diagnosed about a year to a year and a half ago. He follows dietary recommendations to avoid fatty foods due to his diabetes.  He has diabetes with a current A1c of 6.6%. He has been off his medication for two months but is surprised his A1c has not increased significantly. He takes metformin  1000 mg twice daily and is awaiting insurance approval for Ozempic .  He has hypertension. He takes amlodipine  10 mg daily and olmesartan  40 mg daily, having taken his medication two hours prior to the visit.  He has a history of ADHD and depression, for which he takes Wellbutrin  150 mg and 300 mg daily, and citalopram . His depression has improved since resuming his medication. No thoughts of self-harm, and his children are his motivation.  He quit smoking over a year ago and denies any current use of tobacco, vaping, or drugs. 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  He is awaiting a sleep study appointment and has not yet heard from a psychiatrist regarding his ADHD management.   Past Medical History:  Diagnosis Date   Anxiety    Diabetes mellitus without complication (HCC)    Hypertension     Past Surgical History:  Procedure Laterality Date   MOUTH SURGERY      Allergies  Allergen Reactions   Celestone [Betamethasone] Other (See Comments)    very angry and combative    Prednisone Hives   Toradol  [Ketorolac  Tromethamine ]     Outpatient Encounter Medications as of 11/21/2023  Medication Sig   amLODipine  (NORVASC ) 10 MG tablet Take 1 tablet (10 mg total) by mouth daily.   buPROPion  (WELLBUTRIN  XL) 150 MG 24 hr tablet Take 1 tablet (150 mg total) by mouth daily.   buPROPion  (WELLBUTRIN  XL) 300 MG 24 hr tablet Take 1 tablet (300 mg total) by mouth daily.   citalopram  (CELEXA ) 40 MG tablet Take 1 tablet (40 mg total) by mouth daily.   metFORMIN  (GLUCOPHAGE ) 1000 MG tablet Take 1 tablet (1,000 mg total) by mouth 2 (two) times daily with a meal.   olmesartan  (BENICAR ) 40 MG tablet Take 1 tablet (40 mg total) by mouth daily.  Semaglutide ,0.25 or 0.5MG /DOS, (OZEMPIC , 0.25 OR 0.5 MG/DOSE,) 2 MG/1.5ML SOPN Inject 0.5 mg into the skin once a week.   glucosamine-chondroitin 500-400 MG tablet Take 1 tablet 3 (three) times daily by mouth. (Patient not taking: Reported on 11/21/2023)   No facility-administered encounter medications on file as of 11/21/2023.    Review of Systems:  Review of Systems  Constitutional:  Negative for activity change, appetite change and fever.  HENT:  Negative for sore throat.   Eyes: Negative.   Cardiovascular:  Negative for chest pain and leg swelling.  Gastrointestinal:  Negative for abdominal distention, diarrhea and vomiting.  Genitourinary:  Negative for dysuria, frequency  and urgency.  Skin:  Negative for color change.  Neurological:  Negative for dizziness and headaches.  Psychiatric/Behavioral:  Negative for behavioral problems and sleep disturbance. The patient is not nervous/anxious.     Health Maintenance  Topic Date Due   OPHTHALMOLOGY EXAM  Never done   Diabetic kidney evaluation - Urine ACR  Never done   DTaP/Tdap/Td (1 - Tdap) Never done   Hepatitis B Vaccines 19-59 Average Risk (1 of 3 - 19+ 3-dose series) Never done   HPV VACCINES (1 - 3-dose SCDM series) Never done   COVID-19 Vaccine (1 - 2025-26 season) 12/07/2023 (Originally 10/08/2023)   Influenza Vaccine  05/06/2024 (Originally 09/07/2023)   Pneumococcal Vaccine (1 of 2 - PCV) 10/31/2024 (Originally 09/07/2006)   HEMOGLOBIN A1C  04/30/2024   Diabetic kidney evaluation - eGFR measurement  10/31/2024   FOOT EXAM  10/31/2024   Hepatitis C Screening  Completed   HIV Screening  Completed   Meningococcal B Vaccine  Aged Out    Physical Exam: Vitals:   11/21/23 0959  BP: (!) 138/90  Pulse: 72  Temp: 98.5 F (36.9 C)  SpO2: 98%  Weight: 250 lb 9.6 oz (113.7 kg)  Height: 5' 9 (1.753 m)   Body mass index is 37.01 kg/m. Physical Exam Constitutional:      General: He is not in acute distress.    Appearance: He is obese.  HENT:     Head: Normocephalic and atraumatic.     Mouth/Throat:     Mouth: Mucous membranes are moist.  Eyes:     Conjunctiva/sclera: Conjunctivae normal.  Cardiovascular:     Rate and Rhythm: Normal rate and regular rhythm.     Pulses: Normal pulses.     Heart sounds: Normal heart sounds.  Pulmonary:     Effort: Pulmonary effort is normal.     Breath sounds: Normal breath sounds.  Abdominal:     General: Bowel sounds are normal.     Palpations: Abdomen is soft.  Musculoskeletal:        General: No swelling. Normal range of motion.     Cervical back: Normal range of motion.  Skin:    General: Skin is warm and dry.     Comments: Indurated mass, 4 X 3 cm, on  left shoulder  Neurological:     General: No focal deficit present.     Mental Status: He is alert and oriented to person, place, and time.  Psychiatric:        Mood and Affect: Mood normal.        Behavior: Behavior normal.        Thought Content: Thought content normal.        Judgment: Judgment normal.     Labs reviewed: Basic Metabolic Panel: Recent Labs    11/01/23 1614  NA 140  K 4.0  CL 104  CO2 30  GLUCOSE 112*  BUN 13  CREATININE 0.94  CALCIUM 9.1   Liver Function Tests: Recent Labs    11/01/23 1614  AST 37  ALT 63*  BILITOT 0.3  PROT 7.4   No results for input(s): LIPASE, AMYLASE in the last 8760 hours. No results for input(s): AMMONIA in the last 8760 hours. CBC: Recent Labs    11/01/23 1614  WBC 12.2*  NEUTROABS 7,491  HGB 14.8  HCT 43.7  MCV 85.2  PLT 308   Lipid Panel: Recent Labs    11/01/23 1614  CHOL 164  HDL 45  LDLCALC 95  TRIG 144  CHOLHDL 3.6   Lab Results  Component Value Date   HGBA1C 6.6 (H) 11/01/2023    Procedures since last visit: No results found.  Assessment/Plan  1. Mass of skin of left shoulder (Primary) -  4 cm by 3 cm non-tender mass on left shoulder, likely benign (cyst or lipoma). - Order x-ray of the left shoulder. - DG Shoulder Left  2. Type 2 diabetes mellitus with other skin complication, without long-term current use of insulin (HCC) -  A1c 6.6%, well-controlled off medication for two months. Awaiting Ozempic  approval. - Continue metformin  1000 mg twice daily. - Await insurance approval for Ozempic . - Encourage adherence to diabetic diet and exercise regimen.  3. Primary hypertension -  BP 138/90 mmHg, above target. On amlodipine  and olmesartan . Adding diuretic. - Add chlorthalidone at the lowest dose. - Continue amlodipine  10 mg daily. - Continue olmesartan  40 mg daily. - chlorthalidone (HYGROTON) 25 MG tablet; Take 0.5 tablets (12.5 mg total) by mouth daily.  Dispense: 45 tablet;  Refill: 1  4. Attention deficit hyperactivity disorder (ADHD), predominantly inattentive type -  Managed with Wellbutrin . Awaiting psychiatrist follow-up. - Continue Wellbutrin  150 mg and 300 mg for a total of 450 mg daily. - Follow up with psychiatrist.  5. Moderately severe recurrent major depression (HCC) -   Managed with citalopram . Symptoms improved. No suicidal ideation. - Continue citalopram .   Labs/tests ordered:  x-ray of left shoulder   No follow-ups on file.  Symphonie Schneiderman Medina-Vargas, NP

## 2023-12-21 ENCOUNTER — Telehealth: Payer: Self-pay

## 2023-12-21 DIAGNOSIS — E119 Type 2 diabetes mellitus without complications: Secondary | ICD-10-CM

## 2023-12-21 MED ORDER — METFORMIN HCL 1000 MG PO TABS: 1000.0000 mg | ORAL_TABLET | Freq: Two times a day (BID) | ORAL | 5 refills | Status: AC

## 2023-12-21 NOTE — Telephone Encounter (Signed)
 Spoke with patient's Wife in regards to the Prior Auth for patient's Ozempic  and did advise that the paper work has been sent faxed and scan in the patient chart, also that once we received the update on the patient Ozempic  then will call the patient.  Patient wife also stated that they did not receive his metformin , I let her know that we will sent it to the pharmacy. Patient wife did not have any questions at this time.

## 2023-12-21 NOTE — Telephone Encounter (Signed)
 Copied from CRM #[id number]. Topic: Clinical - Medication Prior Auth >> Dec 21, 2023  9:19 AM Cherylann RAMAN wrote: Reason for CRM: Harlene, wife, called requesting an update on Prior Auth for patient's Ozempic . In addition, she states that his prescription for Metformin  was not sent in the pharmacy. Please contact Jessica at [phone].

## 2024-02-18 NOTE — Progress Notes (Unsigned)
 " Psychiatric Initial Adult Assessment  Patient Identification: Douglas Jackson MRN:  [id number] Date of Evaluation:  02/20/2024 Referral Source: Monina Medina-Vargas for ADHD   Assessment:  Douglas Jackson is a 39 y.o. male with a history of ADHD, MDD who presents in person to [facility] for initial evaluation of medication management and mood.  Patient appears to have symptoms of recurrent major depressive disorder, generalized anxiety disorder with panic attacks, as well as symptoms of ADHD including inattention and hyperactivity and ADHD by history and noted during visit today. He has ruminative thoughts but these do not appear to meet criteria for OCD and instead appear to be more correlated with his increased generalized anxiety. His mood symptoms also appear to be affected by recent psychosocial stressors including new 55 month old child with financial stressors. Regarding his medications, given that he reports benefit from the wellbutrin  and has not seen as much benefit from the celexa , we discussed changing from celexa  to lexapro . The risks and alternatives were also discussed with him and he consented to medication trial. We also discussed using propranolol  as needed for panic attacks and risks and side effects including dizziness and bradycardia were discussed with him and he was amenable to a trial of this. He has high blood pressure at baseline and will follow-up with PCP regarding this. Regarding his ADHD symptoms, as noted above he does appear to have a history of this as well as current symptoms. We discussed a separate visit to further clarify prior ADHD diagnosis. Of note, given his high blood pressure will have to closely monitor if stimulants are started in the future.   Risk Assessment: A suicide and violence risk assessment was performed as part of this evaluation. There patient is deemed to be at chronic elevated risk for self-harm/suicide given the following factors:  sense of isolation, current substance abuse, and history of depression. These risk factors are mitigated by the following factors: lack of active SI/HI, no history of previous suicide attempts, motivation for treatment, supportive family, sense of responsibility to family and social supports, minor children living at home, current treatment compliance, effective problem solving skills, safe housing, and support system in agreement with treatment recommendations. The patient is deemed to be at chronic elevated risk for violence given the following factors: high emotional distress. These risk factors are mitigated by the following factors: no known history of violence towards others and connectedness to family. There is no acute risk for suicide or violence at this time. The patient was educated about relevant modifiable risk factors including following recommendations for treatment of psychiatric illness and abstaining from substance abuse.   While future psychiatric events cannot be accurately predicted, the patient does not currently require  acute inpatient psychiatric care and does not currently meet   involuntary commitment criteria.    Plan:  # MDD, recurrent, moderate # GAD with panic attacks  # Obsessional thoughts  -- decrease celexa  20mg  for a week then stop -- start lexapro  5mg  for a week then increase to 10mg   -- continue wellbutrin  XL 450mg  AM  -- start propranolol  10mg  PRN for panic attacks  -- encourage follow-up with therapy   # History of ADHD  -- follow-up for focused assessment on 1/26  # Cannabis use disorder # Tobacco use disorder  -- continue wellbutrin  XL 450mg  AM  -- continue to encourage cessation   Patient was given contact information for behavioral health clinic and was instructed to call 911 for emergencies.  Return to care in:  Future Appointments  Date Time Provider Department Center  03/03/2024  3:00 PM Graham Krabbe, MD [facility] None   03/13/2024  8:40 AM Medina-Vargas, Jereld BROCKS, NP [facility] [address]  03/20/2024  2:00 PM Jolynn Lynwood BIRCH, LCSW [facility] None  03/26/2024  3:00 PM Graham Krabbe, MD [facility] None   Patient was given contact information for behavioral health clinic and was instructed to call 911 for emergencies.    Patient and plan of care will be discussed with the Attending MD who agrees with the above statement and plan.   Subjective:  Chief Complaint: medication management   History of Present Illness:   Labs: 10/2023 CBC stable, CMP with slightly elevated ALT (63), A1c 6.6, Lipid panel wnl EKG: none MRI brain 09/2015 normal Sleep study: pending appt PDMP neg  Patient reports diagnosed with ADHD, hyperactive in 3rd grade, was on medication when 31-33 years old. He reports difficulty with concentrating and changing topic. He reports poor concentration and obsessive thoughts. He reports ruminative thoughts about kids dying. He reports depression, irritability, mood swings. He reports wife wants him to get help. He reports main mood symptom is irritability and decreased concentration for more than 5 years but reports worsened in last 3 years after having kids and having more responsibility. Reports moved from [place] to [place] around 2 years ago and feels things have worsened since then and had an additional child in July which have worsened his mood symptoms and difficulties with concentration. Patient reports sleeps around 5-6 hours. Patient reports stable appetite. Patient reports stressors include finances and responsibilities with children. Patient reports adherence with medications. Patient reports no side effects. Patient reports substance use as cannabis and nicotine pouches. Patient denies SI/HI.   Patient reports in the past 4 years he has had increased frequency of feeling depressed, irritable, feeling like not where he wants to be in life, anhedonia, guilt, increased sleep (12-13 hours), energy,  appetite, and concentration. Patient reports no suicidal ideation. He reports that it will last for a week, sometimes last for longer (2.5 weeks) and tends to happen in wintertime where he feels decreased appetite, not answering the phone, sitting in a dark room and doing endless YouTube.   Patient reports history of increased elevated mood after depressive episodes with increased energy, denies inflated self-esteem and grandiosity, decreased need for sleep, no pressured speech. He reports at baseline he has increased distractibility and will start multiple things and not finish them.    Patient reports history of excessive worry, reports racing thoughts and constantly thinking about future if he has supper ready or work issues. He reports a history of panic attacks will get to the point where he feels nauseous and feels chest pain. He reports last panic attack was on Sunday, reports on average once a week.  Patient reports a history of being yelled at as a kid. He reports trauma as losing people in his life, reports losing his stepdad in May 2000 and maternal grandfather in Sept 2000. He reports no relationship with his biological dad. Patient reports no intrusive symptoms negative alteration in cognition in mood as a result of the trauma, avoidance and increase in arousal and reactivity, and intrusive symptoms.  Patient reports a history of auditory and visual hallucinations as sometimes hearing classical music in his head and sometimes hearing people are calling his name when they are not.   Patient reports a history of obsessive thoughts of his children dying. He  reports it will last for around 15-20 minutes. He reports will sometimes randomly think about it or will come about when he is triggered by seeing something online. He reports that he will try to find something else to catch his attention and can take a few minutes. He reports talking with his kids will help calm him down. He denies compulsive  actions.   He reports diagnosed with ADHD in 3rd grade. He reports he was on dexedrine at that time. He reports he took this 2-3 years until his stepdad died and then his mom went to using drugs. He reports he has a GED and graduated a year before his class from [employer]. He reports he was able to finish class with smaller class and teacher. He reports he wasn't interested in classes in high school. He reports mainly being in regular classes but was only in an extra language class in 6th grade. He reports he got suspended from high school after argument with teacher. He reports at work when he has to do stuff on excel and reports he feels bored so he has difficulty concentrating with technology. He denies making careless mistakes at work. He denies difficulty organizing tasks. He does report losing things necessary for tasks. He reports easily distracted by extraneous stimuli. He reports being forgetful in paying bills and keeping appointments. He reports that he is very fidgety and restless and he reports difficulty with waiting and will sometimes interrupt conversation. He reports having diffuclty with waiting his turn to talk since young. He is unsure if he had psychological testing in the past. He reports only being interested in things that he wants to be interested in.   For medications, he reports citalopram  helped initially, has been taking for 5 years. He has been taking wellbutrin  for 2 years and notices increased irritability when he is not taking wellbutrin ..   Past Psychiatric History:  Diagnoses: MDD, ADHD, GAD  Medication trials: wellbutrin  XL 450, citalopram  40, zoloft  (over 10 years on max amount, stopped working), buspar  (reports ineffective, took for 6 months, felt irritable), cymbalta (can't remember but reports increased edgy) Previous psychiatrist/therapist: saw psychiatrist and therapist at [facility] when younger. Hospitalizations: none Suicide attempts: none SIB: none Hx of violence  towards others: none Current access to guns: owns a gun, is locked and has trigger lock Hx of trauma/abuse: as above   Substance Abuse History in the last 12 months:  Yes.   He smokes cannabis once or twice a week to relax.  He reports using nicotine pouches (1-2) every day.   Past Medical History:  Past Medical History:  Diagnosis Date   Anxiety    Diabetes mellitus without complication (HCC)    Hypertension     Past Surgical History:  Procedure Laterality Date   MOUTH SURGERY     Medical Dx: T2DM, HTN Medications: metformin , amlodipine , olmesartan , chlorthalidone  Trauma: reports played football up to high school and likely had concussions  Seizures: denies   Family Psychiatric History:  Mother with history of substance abuse issues, bipolar, schizophrenia, depression Father:  unknown  Brother: substance abuse issues      Family History:  Family History  Problem Relation Age of Onset   COPD Mother    Heart disease Mother    Hypertension Mother    Fibromyalgia Mother    Diabetes Mother    Drug abuse Father    Alcohol abuse Father    Hypertension Sister    Heart disease Maternal Grandmother  Diabetes Maternal Grandfather    Kidney disease Maternal Grandfather     Social History:   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, [employer] Housing: lives with wife and 2 children  Children: 2 children, most recent born 6 months ago  Marital Status: married   Social History   Socioeconomic History   Marital status: Married    Spouse name: Not on file   Number of children: Not on file   Years of education: GED   Highest education level: GED or equivalent  Occupational History   Occupation: [employer]    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, 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  Tobacco Use   Smoking status: Every Day    Current packs/day: 0.20    Average packs/day: 0.2 packs/day for 10.0 years (2.0 ttl pk-yrs)    Types: Cigarettes   Smokeless tobacco: Former   Tobacco comments:     Vapes and some smokeless tobacco  Substance and Sexual Activity   Alcohol use: Yes    Alcohol/week: 1.0 standard drink of alcohol    Types: 1 Cans of beer per week    Comment: rare   Drug use: No   Sexual activity: Not on file  Other Topics Concern   Not on file  Social History Narrative   Not on file   Social Drivers of Health   Tobacco Use: High Risk (11/21/2023)   Patient History    Smoking Tobacco Use: Every Day    Smokeless Tobacco Use: Former    Passive Exposure: Not on Actuary Strain: Patient Declined (11/21/2023)   Overall Financial Resource Strain (CARDIA)    Difficulty of Paying Living Expenses: Patient declined  Food Insecurity: Patient Declined (11/21/2023)   Epic    Worried About Programme Researcher, Broadcasting/film/video in the Last Year: Patient declined    Barista in the Last Year: Patient declined  Transportation Needs: Patient Declined (11/21/2023)   Epic    Lack of Transportation (Medical): Patient declined    Lack of Transportation (Non-Medical): Patient declined  Physical Activity: Unknown (11/21/2023)   Exercise Vital Sign    Days of Exercise per Week: Patient declined    Minutes of Exercise per Session: Not on file  Stress: Patient Declined (11/21/2023)   [employer] of Occupational Health - Occupational Stress Questionnaire    Feeling of Stress: Patient declined  Social Connections: Unknown (11/21/2023)   Social Connection and Isolation Panel    Frequency of Communication with Friends and Family: Patient declined    Frequency of Social Gatherings with Friends and Family: Patient declined    Attends Religious Services: Patient declined    Active Member of Clubs or Organizations: Patient declined    Attends Banker Meetings: Not on file    Marital Status: Married  Depression (PHQ2-9): High Risk (11/01/2023)   Depression (PHQ2-9)    PHQ-2 Score: 15  Alcohol Screen: Low Risk (11/21/2023)   Alcohol Screen    Last Alcohol  Screening Score (AUDIT): 0  Housing: Patient Declined (11/21/2023)   Epic    Unable to Pay for Housing in the Last Year: Patient declined    Number of Times Moved in the Last Year: Not on file    Homeless in the Last Year: Patient declined  Utilities: Not on file  Health Literacy: Not on file    Additional Social History: updated  Allergies:  Allergies[1]  Current Medications: Current Outpatient Medications  Medication Sig Dispense Refill   citalopram  (CELEXA ) 20 MG tablet Take  1 tablet (20 mg total) by mouth daily. 7 tablet 0   [START ON 02/27/2024] escitalopram  (LEXAPRO ) 10 MG tablet Take 1 tablet (10 mg total) by mouth daily. 30 tablet 0   escitalopram  (LEXAPRO ) 5 MG tablet Take 1 tablet (5 mg total) by mouth daily. 7 tablet 0   propranolol  (INDERAL ) 10 MG tablet Take 1 tablet (10 mg total) by mouth daily as needed (anxiety, panic attacks, sleep). 60 tablet 0   amLODipine  (NORVASC ) 10 MG tablet Take 1 tablet (10 mg total) by mouth daily. 90 tablet 1   buPROPion  (WELLBUTRIN  XL) 150 MG 24 hr tablet Take 1 tablet (150 mg total) by mouth daily. 90 tablet 1   buPROPion  (WELLBUTRIN  XL) 300 MG 24 hr tablet Take 1 tablet (300 mg total) by mouth daily. 90 tablet 1   chlorthalidone  (HYGROTON ) 25 MG tablet Take 0.5 tablets (12.5 mg total) by mouth daily. 45 tablet 1   glucosamine-chondroitin 500-400 MG tablet Take 1 tablet 3 (three) times daily by mouth. (Patient not taking: Reported on 11/21/2023)     metFORMIN  (GLUCOPHAGE ) 1000 MG tablet Take 1 tablet (1,000 mg total) by mouth 2 (two) times daily with a meal. 60 tablet 5   olmesartan  (BENICAR ) 40 MG tablet Take 1 tablet (40 mg total) by mouth daily. 90 tablet 1   Semaglutide ,0.25 or 0.5MG /DOS, (OZEMPIC , 0.25 OR 0.5 MG/DOSE,) 2 MG/1.5ML SOPN Inject 0.5 mg into the skin once a week. 4.5 mL 1   No current facility-administered medications for this visit.   ROS: Review of Systems Respiratory:  Negative for shortness of breath.    Cardiovascular:  Negative for chest pain.  Gastrointestinal:  Negative for abdominal pain, constipation, diarrhea, nausea and vomiting.  Neurological:  Negative for headaches.   Objective:  Psychiatric Specialty Exam: Blood pressure (!) 173/104, pulse 89, weight 243 lb 12.8 oz (110.6 kg).Body mass index is 36 kg/m.  General Appearance: Casual, wearing hoodie  Eye Contact:  Good  Speech:  Clear and Coherent  Volume:  Normal  Mood:  Irritable  Affect:  Appropriate  Thought Content: Logical   Suicidal Thoughts:  No  Homicidal Thoughts:  No  Thought Process:  Overall Linear, intermittently circumstantial  Orientation:  Full (Time, Place, and Person)    Memory:  Grossly intact   Judgment:  Fair  Insight:  Fair  Concentration:  Concentration: Poor and Attention Span: Poor  Recall:  not formally assessed   Fund of Knowledge: Fair  Language: Fair  Psychomotor Activity:  Restlessness  Akathisia:  No  AIMS (if indicated): not done  Assets:  Communication Skills Desire for Improvement Financial Resources/Insurance Housing Intimacy Resilience Vocational/Educational  ADL's:  Intact  Cognition: WNL  Sleep:  Fair   PE: General: well-appearing; no acute distress  Pulm: no increased work of breathing on room air  Strength & Muscle Tone: within normal limits Neuro: no focal neurological deficits observed  Gait & Station: normal  Metabolic Disorder Labs: Lab Results  Component Value Date   HGBA1C 6.6 (H) 11/01/2023   MPG 143 11/01/2023   MPG 120 12/22/2016   No results found for: PROLACTIN Lab Results  Component Value Date   CHOL 164 11/01/2023   TRIG 144 11/01/2023   HDL 45 11/01/2023   CHOLHDL 3.6 11/01/2023   VLDL 25 10/18/2015   LDLCALC 95 11/01/2023   LDLCALC 88 12/22/2016   No results found for: TSH  Therapeutic Level Labs: No results found for: LITHIUM No results found for: CBMZ No results found  for: VALPROATE  Screenings:  PHQ2-9     Flowsheet Row Office Visit from 11/01/2023 in [facility] Office Visit from 12/22/2016 in [facility] Office Visit from 10/18/2015 in [facility]  PHQ-2 Total Score 4 0 1  PHQ-9 Total Score 15 -- --    Collaboration of Care: Collaboration of Care: Medication Management AEB attending MD  Patient/Guardian was advised Release of Information must be obtained prior to any record release in order to collaborate their care with an outside provider. Patient/Guardian was advised if they have not already done so to contact the registration department to sign all necessary forms in order for us  to release information regarding their care.   Consent: Patient/Guardian gives verbal consent for treatment and assignment of benefits for services provided during this visit. Patient/Guardian expressed understanding and agreed to proceed.   Shaune Westfall, MD, PGY-3 1/14/20262:59 PM      [1]  Allergies Allergen Reactions   Celestone [Betamethasone] Other (See Comments)    very angry and combative    Prednisone Hives   Toradol  [Ketorolac  Tromethamine ]    "

## 2024-02-20 ENCOUNTER — Ambulatory Visit (HOSPITAL_BASED_OUTPATIENT_CLINIC_OR_DEPARTMENT_OTHER): Payer: Self-pay | Admitting: Psychiatry

## 2024-02-20 VITALS — BP 173/104 | HR 89 | Wt 243.8 lb

## 2024-02-20 DIAGNOSIS — F1210 Cannabis abuse, uncomplicated: Secondary | ICD-10-CM | POA: Diagnosis not present

## 2024-02-20 DIAGNOSIS — F17200 Nicotine dependence, unspecified, uncomplicated: Secondary | ICD-10-CM | POA: Diagnosis not present

## 2024-02-20 DIAGNOSIS — F428 Other obsessive-compulsive disorder: Secondary | ICD-10-CM

## 2024-02-20 DIAGNOSIS — Z8659 Personal history of other mental and behavioral disorders: Secondary | ICD-10-CM | POA: Diagnosis not present

## 2024-02-20 DIAGNOSIS — F331 Major depressive disorder, recurrent, moderate: Secondary | ICD-10-CM | POA: Diagnosis not present

## 2024-02-20 DIAGNOSIS — F410 Panic disorder [episodic paroxysmal anxiety] without agoraphobia: Secondary | ICD-10-CM | POA: Diagnosis not present

## 2024-02-20 DIAGNOSIS — F411 Generalized anxiety disorder: Secondary | ICD-10-CM

## 2024-02-20 MED ORDER — CITALOPRAM HYDROBROMIDE 20 MG PO TABS: 20.0000 mg | ORAL_TABLET | Freq: Every day | ORAL | 0 refills | Status: DC

## 2024-02-20 MED ORDER — ESCITALOPRAM OXALATE 10 MG PO TABS: 10.0000 mg | ORAL_TABLET | Freq: Every day | ORAL | 0 refills | Status: AC

## 2024-02-20 MED ORDER — PROPRANOLOL HCL 10 MG PO TABS: 10.0000 mg | ORAL_TABLET | Freq: Every day | ORAL | 0 refills | Status: AC | PRN

## 2024-02-20 MED ORDER — ESCITALOPRAM OXALATE 5 MG PO TABS: 5.0000 mg | ORAL_TABLET | Freq: Every day | ORAL | 0 refills | Status: AC

## 2024-02-21 ENCOUNTER — Ambulatory Visit: Payer: Self-pay | Admitting: Adult Health

## 2024-02-21 NOTE — Addendum Note (Signed)
 Addended by: CARVIN CROCK on: 02/21/2024 07:58 AM   Modules accepted: Level of Service

## 2024-02-26 NOTE — Progress Notes (Unsigned)
 " Psychiatric Follow-up Adult Assessment  Patient Identification: Douglas Jackson MRN:  [id number] Date of Evaluation:  02/26/2024 Referral Source: Monina Medina-Vargas for ADHD   Assessment:  Douglas Jackson is a 39 y.o. male with a history of ADHD, MDD who presents in person to [facility] for initial evaluation of medication management and mood.  Patient appears to have symptoms of recurrent major depressive disorder, generalized anxiety disorder with panic attacks, as well as symptoms of ADHD including inattention and hyperactivity and ADHD by history and noted during visit today. He has ruminative thoughts but these do not appear to meet criteria for OCD and instead appear to be more correlated with his increased generalized anxiety. His mood symptoms also appear to be affected by recent psychosocial stressors including new 46 month old child with financial stressors. Regarding his medications, given that he reports benefit from the wellbutrin  and has not seen as much benefit from the celexa , we discussed changing from celexa  to lexapro . The risks and alternatives were also discussed with him and he consented to medication trial. We also discussed using propranolol  as needed for panic attacks and risks and side effects including dizziness and bradycardia were discussed with him and he was amenable to a trial of this. He has high blood pressure at baseline and will follow-up with PCP regarding this. Regarding his ADHD symptoms, as noted above he does appear to have a history of this as well as current symptoms. We discussed a separate visit to further clarify prior ADHD diagnosis. Of note, given his high blood pressure will have to closely monitor if stimulants are started in the future.   Risk Assessment: A suicide and violence risk assessment was performed as part of this evaluation. There patient is deemed to be at chronic elevated risk for self-harm/suicide given the following  factors: sense of isolation, current substance abuse, and history of depression. These risk factors are mitigated by the following factors: lack of active SI/HI, no history of previous suicide attempts, motivation for treatment, supportive family, sense of responsibility to family and social supports, minor children living at home, current treatment compliance, effective problem solving skills, safe housing, and support system in agreement with treatment recommendations. The patient is deemed to be at chronic elevated risk for violence given the following factors: high emotional distress. These risk factors are mitigated by the following factors: no known history of violence towards others and connectedness to family. There is no acute risk for suicide or violence at this time. The patient was educated about relevant modifiable risk factors including following recommendations for treatment of psychiatric illness and abstaining from substance abuse.   While future psychiatric events cannot be accurately predicted, the patient does not currently require  acute inpatient psychiatric care and does not currently meet [place]  involuntary commitment criteria.    Plan:  # MDD, recurrent, moderate # GAD with panic attacks  # Obsessional thoughts  -- decrease celexa  20mg  for a week then stop -- start lexapro  5mg  for a week then increase to 10mg   -- continue wellbutrin  XL 450mg  AM  -- start propranolol  10mg  PRN for panic attacks  -- encourage follow-up with therapy   # History of ADHD  -- follow-up for focused assessment on 1/26  # Cannabis use disorder # Tobacco use disorder  -- continue wellbutrin  XL 450mg  AM  -- continue to encourage cessation   Patient was given contact information for behavioral health clinic and was instructed to call 911 for emergencies.  Return to care in:  Future Appointments  Date Time Provider Department Center  03/03/2024  3:00 PM Graham Krabbe, MD [facility] None   03/13/2024  8:40 AM Medina-Vargas, Jereld BROCKS, NP [facility] [address]  03/13/2024  1:00 PM Carlie Huxley, LCSW [facility] None  03/26/2024  3:00 PM Graham Krabbe, MD [facility] None   Patient was given contact information for behavioral health clinic and was instructed to call 911 for emergencies.    Patient and plan of care will be discussed with the Attending MD who agrees with the above statement and plan.   Subjective:  Chief Complaint: medication management   Interval History: --  Patient reports diagnosed with ADHD, hyperactive in 3rd grade, was on medication when 28-32 years old. He reports difficulty with concentrating and changing topic. He reports poor concentration and obsessive thoughts. He reports ruminative thoughts about kids dying. He reports depression, irritability, mood swings. He reports wife wants him to get help. He reports main mood symptom is irritability and decreased concentration for more than 5 years but reports worsened in last 3 years after having kids and having more responsibility. Reports moved from [place] to [place] around 2 years ago and feels things have worsened since then and had an additional child in July which have worsened his mood symptoms and difficulties with concentration. Patient reports sleeps around 5-6 hours. Patient reports stable appetite. Patient reports stressors include finances and responsibilities with children. Patient reports adherence with medications. Patient reports no side effects. Patient reports substance use as cannabis and nicotine pouches. Patient denies SI/HI.   Patient reports in the past 4 years he has had increased frequency of feeling depressed, irritable, feeling like not where he wants to be in life, anhedonia, guilt, increased sleep (12-13 hours), energy, appetite, and concentration. Patient reports no suicidal ideation. He reports that it will last for a week, sometimes last for longer (2.5 weeks) and tends to happen in wintertime  where he feels decreased appetite, not answering the phone, sitting in a dark room and doing endless YouTube.   Patient reports history of increased elevated mood after depressive episodes with increased energy, denies inflated self-esteem and grandiosity, decreased need for sleep, no pressured speech. He reports at baseline he has increased distractibility and will start multiple things and not finish them.    Patient reports history of excessive worry, reports racing thoughts and constantly thinking about future if he has supper ready or work issues. He reports a history of panic attacks will get to the point where he feels nauseous and feels chest pain. He reports last panic attack was on Sunday, reports on average once a week.  Patient reports a history of being yelled at as a kid. He reports trauma as losing people in his life, reports losing his stepdad in May 2000 and maternal grandfather in Sept 2000. He reports no relationship with his biological dad. Patient reports no intrusive symptoms negative alteration in cognition in mood as a result of the trauma, avoidance and increase in arousal and reactivity, and intrusive symptoms.  Patient reports a history of auditory and visual hallucinations as sometimes hearing classical music in his head and sometimes hearing people are calling his name when they are not.   Patient reports a history of obsessive thoughts of his children dying. He reports it will last for around 15-20 minutes. He reports will sometimes randomly think about it or will come about when he is triggered by seeing something online. He reports that  he will try to find something else to catch his attention and can take a few minutes. He reports talking with his kids will help calm him down. He denies compulsive actions.   He reports diagnosed with ADHD in 3rd grade. He reports he was on dexedrine at that time. He reports he took this 2-3 years until his stepdad died and then his mom  went to using drugs. He reports he has a GED and graduated a year before his class from [employer]. He reports he was able to finish class with smaller class and teacher. He reports he wasn't interested in classes in high school. He reports mainly being in regular classes but was only in an extra language class in 6th grade. He reports he got suspended from high school after argument with teacher. He reports at work when he has to do stuff on excel and reports he feels bored so he has difficulty concentrating with technology. He denies making careless mistakes at work. He denies difficulty organizing tasks. He does report losing things necessary for tasks. He reports easily distracted by extraneous stimuli. He reports being forgetful in paying bills and keeping appointments. He reports that he is very fidgety and restless and he reports difficulty with waiting and will sometimes interrupt conversation. He reports having diffuclty with waiting his turn to talk since young. He is unsure if he had psychological testing in the past. He reports only being interested in things that he wants to be interested in.   For medications, he reports citalopram  helped initially, has been taking for 5 years. He has been taking wellbutrin  for 2 years and notices increased irritability when he is not taking wellbutrin ..   Past Psychiatric History:  Diagnoses: MDD, ADHD, GAD  Medication trials: wellbutrin  XL 450, citalopram  40, zoloft  (over 10 years on max amount, stopped working), buspar  (reports ineffective, took for 6 months, felt irritable), cymbalta (can't remember but reports increased edgy) Previous psychiatrist/therapist: saw psychiatrist and therapist at [facility] when younger. Hospitalizations: none Suicide attempts: none SIB: none Hx of violence towards others: none Current access to guns: owns a gun, is locked and has trigger lock Hx of trauma/abuse: as above   Substance Abuse History in the last 12 months:  Yes.    He smokes cannabis once or twice a week to relax.  He reports using nicotine pouches (1-2) every day.   Past Medical History:  Past Medical History:  Diagnosis Date   Anxiety    Diabetes mellitus without complication (HCC)    Hypertension     Past Surgical History:  Procedure Laterality Date   MOUTH SURGERY     Medical Dx: T2DM, HTN Medications: metformin , amlodipine , olmesartan , chlorthalidone  Trauma: reports played football up to high school and likely had concussions  Seizures: denies   Family Psychiatric History:  Mother with history of substance abuse issues, bipolar, schizophrenia, depression Father:  unknown  Brother: substance abuse issues      Family History:  Family History  Problem Relation Age of Onset   COPD Mother    Heart disease Mother    Hypertension Mother    Fibromyalgia Mother    Diabetes Mother    Drug abuse Father    Alcohol abuse Father    Hypertension Sister    Heart disease Maternal Grandmother    Diabetes Maternal Grandfather    Kidney disease Maternal Grandfather     Social History:   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, [employer] Housing: lives with wife and 2  children  Children: 2 children, most recent born 6 months ago  Marital Status: married   Social History   Socioeconomic History   Marital status: Married    Spouse name: Not on file   Number of children: Not on file   Years of education: GED   Highest education level: GED or equivalent  Occupational History   Occupation: [employer]    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, 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  Tobacco Use   Smoking status: Every Day    Current packs/day: 0.20    Average packs/day: 0.2 packs/day for 10.0 years (2.0 ttl pk-yrs)    Types: Cigarettes   Smokeless tobacco: Former   Tobacco comments:    Vapes and some smokeless tobacco  Substance and Sexual Activity   Alcohol use: Yes    Alcohol/week: 1.0 standard drink of alcohol    Types: 1 Cans of beer per week     Comment: rare   Drug use: No   Sexual activity: Not on file  Other Topics Concern   Not on file  Social History Narrative   Not on file   Social Drivers of Health   Tobacco Use: High Risk (11/21/2023)   Patient History    Smoking Tobacco Use: Every Day    Smokeless Tobacco Use: Former    Passive Exposure: Not on Actuary Strain: Patient Declined (11/21/2023)   Overall Financial Resource Strain (CARDIA)    Difficulty of Paying Living Expenses: Patient declined  Food Insecurity: Patient Declined (11/21/2023)   Epic    Worried About Programme Researcher, Broadcasting/film/video in the Last Year: Patient declined    Barista in the Last Year: Patient declined  Transportation Needs: Patient Declined (11/21/2023)   Epic    Lack of Transportation (Medical): Patient declined    Lack of Transportation (Non-Medical): Patient declined  Physical Activity: Unknown (11/21/2023)   Exercise Vital Sign    Days of Exercise per Week: Patient declined    Minutes of Exercise per Session: Not on file  Stress: Patient Declined (11/21/2023)   [employer] of Occupational Health - Occupational Stress Questionnaire    Feeling of Stress: Patient declined  Social Connections: Unknown (11/21/2023)   Social Connection and Isolation Panel    Frequency of Communication with Friends and Family: Patient declined    Frequency of Social Gatherings with Friends and Family: Patient declined    Attends Religious Services: Patient declined    Active Member of Clubs or Organizations: Patient declined    Attends Banker Meetings: Not on file    Marital Status: Married  Depression (PHQ2-9): High Risk (11/01/2023)   Depression (PHQ2-9)    PHQ-2 Score: 15  Alcohol Screen: Low Risk (11/21/2023)   Alcohol Screen    Last Alcohol Screening Score (AUDIT): 0  Housing: Patient Declined (11/21/2023)   Epic    Unable to Pay for Housing in the Last Year: Patient declined    Number of Times Moved in the Last  Year: Not on file    Homeless in the Last Year: Patient declined  Utilities: Not on file  Health Literacy: Not on file    Additional Social History: updated  Allergies:  Allergies[1]  Current Medications: Current Outpatient Medications  Medication Sig Dispense Refill   amLODipine  (NORVASC ) 10 MG tablet Take 1 tablet (10 mg total) by mouth daily. 90 tablet 1   buPROPion  (WELLBUTRIN  XL) 150 MG 24 hr tablet Take 1 tablet (150 mg total) by mouth daily.  90 tablet 1   buPROPion  (WELLBUTRIN  XL) 300 MG 24 hr tablet Take 1 tablet (300 mg total) by mouth daily. 90 tablet 1   chlorthalidone  (HYGROTON ) 25 MG tablet Take 0.5 tablets (12.5 mg total) by mouth daily. 45 tablet 1   citalopram  (CELEXA ) 20 MG tablet Take 1 tablet (20 mg total) by mouth daily. 7 tablet 0   [START ON 02/27/2024] escitalopram  (LEXAPRO ) 10 MG tablet Take 1 tablet (10 mg total) by mouth daily. 30 tablet 0   escitalopram  (LEXAPRO ) 5 MG tablet Take 1 tablet (5 mg total) by mouth daily. 7 tablet 0   glucosamine-chondroitin 500-400 MG tablet Take 1 tablet 3 (three) times daily by mouth. (Patient not taking: Reported on 11/21/2023)     metFORMIN  (GLUCOPHAGE ) 1000 MG tablet Take 1 tablet (1,000 mg total) by mouth 2 (two) times daily with a meal. 60 tablet 5   olmesartan  (BENICAR ) 40 MG tablet Take 1 tablet (40 mg total) by mouth daily. 90 tablet 1   propranolol  (INDERAL ) 10 MG tablet Take 1 tablet (10 mg total) by mouth daily as needed (anxiety, panic attacks, sleep). 60 tablet 0   Semaglutide ,0.25 or 0.5MG /DOS, (OZEMPIC , 0.25 OR 0.5 MG/DOSE,) 2 MG/1.5ML SOPN Inject 0.5 mg into the skin once a week. 4.5 mL 1   No current facility-administered medications for this visit.   ROS: Review of Systems Respiratory:  Negative for shortness of breath.   Cardiovascular:  Negative for chest pain.  Gastrointestinal:  Negative for abdominal pain, constipation, diarrhea, nausea and vomiting.  Neurological:  Negative for headaches.    Objective:  Psychiatric Specialty Exam: There were no vitals taken for this visit.There is no height or weight on file to calculate BMI.  General Appearance: Casual, wearing hoodie  Eye Contact:  Good  Speech:  Clear and Coherent  Volume:  Normal  Mood:  Irritable  Affect:  Appropriate  Thought Content: Logical   Suicidal Thoughts:  No  Homicidal Thoughts:  No  Thought Process:  Overall Linear, intermittently circumstantial  Orientation:  Full (Time, Place, and Person)    Memory:  Grossly intact   Judgment:  Fair  Insight:  Fair  Concentration:  Concentration: Poor and Attention Span: Poor  Recall:  not formally assessed   Fund of Knowledge: Fair  Language: Fair  Psychomotor Activity:  Restlessness  Akathisia:  No  AIMS (if indicated): not done  Assets:  Communication Skills Desire for Improvement Financial Resources/Insurance Housing Intimacy Resilience Vocational/Educational  ADL's:  Intact  Cognition: WNL  Sleep:  Fair   PE: General: well-appearing; no acute distress  Pulm: no increased work of breathing on room air  Strength & Muscle Tone: within normal limits Neuro: no focal neurological deficits observed  Gait & Station: normal  Metabolic Disorder Labs: Lab Results  Component Value Date   HGBA1C 6.6 (H) 11/01/2023   MPG 143 11/01/2023   MPG 120 12/22/2016   No results found for: PROLACTIN Lab Results  Component Value Date   CHOL 164 11/01/2023   TRIG 144 11/01/2023   HDL 45 11/01/2023   CHOLHDL 3.6 11/01/2023   VLDL 25 10/18/2015   LDLCALC 95 11/01/2023   LDLCALC 88 12/22/2016   No results found for: TSH  Therapeutic Level Labs: No results found for: LITHIUM No results found for: CBMZ No results found for: VALPROATE  Screenings:  PHQ2-9    Flowsheet Row Office Visit from 11/01/2023 in [facility] Office Visit from 12/22/2016 in [facility] Office Visit  from 10/18/2015 in [facility]  PHQ-2 Total Score 4 0 1  PHQ-9 Total Score 15 -- --   DIVA Assessment   Part 1: Symptoms of attention deficit A1: Often fails to pay close attention to details or makes careless mistakes in schoolwork, work or during other activities - childhood  adulthood  *** A2: Often has difficulty sustaining attention in tasks or play- childhood  adulthood *** A3: Often does not seem to listen when spoken to directly- childhood  adulthood *** A4: Often does not follow through on instructions and fails to finish schoolwork, chores, or duties in the workplace- childhood  adulthood *** A5: Often has difficulty organizing tasks and activities- childhood  adulthood *** A6: Often avoids, dislikes, or is reluctant to engage in past that require sustained mental effort- childhood  adulthood *** A7: Often loses things necessary for tasks or activities- childhood  adulthood *** A8: Often easily distracted by extraneous stimuli- childhood  adulthood *** A9: Often forgetful in daily activities- childhood  adulthood ***  Part 2: Symptoms of hyperactivity-impulsivity  H/I 1: Often fidgets with hands or feet or squirms in seat- childhood  adulthood  ***  H/I 2: Often leaves seat in classroom or in other situations in which remaining seated is expected- childhood  adulthood  ***  H/I 3: Often runs about or climbs excessively in situations in which it is inappropriate- childhood  adulthood  ***  H/I 4: Often has difficulty playing or engaging in leisure activities quietly- childhood  adulthood  ***  H/I 5: Is often on the go or often acts as if driven by a motor- childhood  adulthood  ***  H/I 6: Often talks excessively- childhood  adulthood  ***  H/I 7: Often blurts out answers before questions have been completed- childhood  adulthood  ***  H/I 8: Often has difficulty waiting turn- childhood  adulthood  ***  H/I 9: Often interrupts or  intrudes on others- childhood  adulthood  ***   DSM criteria A: Persistent pattern of inattention and/or hyperactivity-impulsivity that interferes with functioning of 6 or more symptoms that persisted for at least 6 months? *** DSM criteria B: Several of the symptoms were present prior to age 75? *** DSM criteria C: Several symptoms are present in 2 or more settings? *** DSM criteria D: There is clear evidence that the symptoms interfere with or reduce the quality of social, academic, or occupational functioning? *** DSM criteria E: The symptoms do not occur exclusively during the course of a psychotic disorder and are not better explained by another mental disorder (mood, anxiety, dissociation, personality, or substances)? ***   Collaboration of Care: Collaboration of Care: Medication Management AEB attending MD  Patient/Guardian was advised Release of Information must be obtained prior to any record release in order to collaborate their care with an outside provider. Patient/Guardian was advised if they have not already done so to contact the registration department to sign all necessary forms in order for us  to release information regarding their care.   Consent: Patient/Guardian gives verbal consent for treatment and assignment of benefits for services provided during this visit. Patient/Guardian expressed understanding and agreed to proceed.   Lashawnta Burgert, MD, PGY-3 1/20/20268:13 AM       [1]  Allergies Allergen Reactions   Celestone [Betamethasone] Other (See Comments)    very angry and combative    Prednisone Hives   Toradol  [Ketorolac  Tromethamine ]    "

## 2024-02-28 NOTE — Progress Notes (Unsigned)
 " Psychiatric Follow-up Adult Assessment  Patient Identification: Douglas Jackson MRN:  [id number] Date of Evaluation:  02/28/2024 Referral Source: Monina Medina-Vargas for ADHD   Assessment:  Douglas Jackson is a 39 y.o. male with a history of ADHD, MDD who presents in person to [facility] for initial evaluation of medication management and mood.  Patient appears to have symptoms of recurrent major depressive disorder, generalized anxiety disorder with panic attacks, as well as symptoms of ADHD including inattention and hyperactivity and ADHD by history and noted during visit today. He has ruminative thoughts but these do not appear to meet criteria for OCD and instead appear to be more correlated with his increased generalized anxiety. His mood symptoms also appear to be affected by recent psychosocial stressors including new 41 month old child with financial stressors. Regarding his medications, given that he reports benefit from the wellbutrin  and has not seen as much benefit from the celexa , we discussed changing from celexa  to lexapro . The risks and alternatives were also discussed with him and he consented to medication trial. We also discussed using propranolol  as needed for panic attacks and risks and side effects including dizziness and bradycardia were discussed with him and he was amenable to a trial of this. He has high blood pressure at baseline and will follow-up with PCP regarding this. Regarding his ADHD symptoms, as noted above he does appear to have a history of this as well as current symptoms. We discussed a separate visit to further clarify prior ADHD diagnosis. Of note, given his high blood pressure will have to closely monitor if stimulants are started in the future.   Risk Assessment: A suicide and violence risk assessment was performed as part of this evaluation. There patient is deemed to be at chronic elevated risk for self-harm/suicide given the following  factors: sense of isolation, current substance abuse, and history of depression. These risk factors are mitigated by the following factors: lack of active SI/HI, no history of previous suicide attempts, motivation for treatment, supportive family, sense of responsibility to family and social supports, minor children living at home, current treatment compliance, effective problem solving skills, safe housing, and support system in agreement with treatment recommendations. The patient is deemed to be at chronic elevated risk for violence given the following factors: high emotional distress. These risk factors are mitigated by the following factors: no known history of violence towards others and connectedness to family. There is no acute risk for suicide or violence at this time. The patient was educated about relevant modifiable risk factors including following recommendations for treatment of psychiatric illness and abstaining from substance abuse.   While future psychiatric events cannot be accurately predicted, the patient does not currently require  acute inpatient psychiatric care and does not currently meet [place]  involuntary commitment criteria.    Plan:  # MDD, recurrent, moderate # GAD with panic attacks  # Obsessional thoughts  -- decrease celexa  20mg  for a week then stop -- start lexapro  5mg  for a week then increase to 10mg   -- continue wellbutrin  XL 450mg  AM  -- start propranolol  10mg  PRN for panic attacks  -- encourage follow-up with therapy   # History of ADHD  -- follow-up for focused assessment on 1/26  # Cannabis use disorder # Tobacco use disorder  -- continue wellbutrin  XL 450mg  AM  -- continue to encourage cessation   Patient was given contact information for behavioral health clinic and was instructed to call 911 for emergencies.  Return to care in:  Future Appointments  Date Time Provider Department Center  03/05/2024  8:00 AM Graham Krabbe, MD [facility] None   03/13/2024  8:40 AM Medina-Vargas, Jereld BROCKS, NP [facility] [address]  03/13/2024  1:00 PM Carlie Huxley, LCSW [facility] None  03/26/2024  3:00 PM Graham Krabbe, MD [facility] None   Patient was given contact information for behavioral health clinic and was instructed to call 911 for emergencies.    Patient and plan of care will be discussed with the Attending MD who agrees with the above statement and plan.   Subjective:  Chief Complaint: medication management   Interval History: --  Patient reports diagnosed with ADHD, hyperactive in 3rd grade, was on medication when 53-56 years old. He reports difficulty with concentrating and changing topic. He reports poor concentration and obsessive thoughts. He reports ruminative thoughts about kids dying. He reports depression, irritability, mood swings. He reports wife wants him to get help. He reports main mood symptom is irritability and decreased concentration for more than 5 years but reports worsened in last 3 years after having kids and having more responsibility. Reports moved from [place] to [place] around 2 years ago and feels things have worsened since then and had an additional child in July which have worsened his mood symptoms and difficulties with concentration. Patient reports sleeps around 5-6 hours. Patient reports stable appetite. Patient reports stressors include finances and responsibilities with children. Patient reports adherence with medications. Patient reports no side effects. Patient reports substance use as cannabis and nicotine pouches. Patient denies SI/HI.   Patient reports in the past 4 years he has had increased frequency of feeling depressed, irritable, feeling like not where he wants to be in life, anhedonia, guilt, increased sleep (12-13 hours), energy, appetite, and concentration. Patient reports no suicidal ideation. He reports that it will last for a week, sometimes last for longer (2.5 weeks) and tends to happen in wintertime  where he feels decreased appetite, not answering the phone, sitting in a dark room and doing endless YouTube.   Patient reports history of increased elevated mood after depressive episodes with increased energy, denies inflated self-esteem and grandiosity, decreased need for sleep, no pressured speech. He reports at baseline he has increased distractibility and will start multiple things and not finish them.    Patient reports history of excessive worry, reports racing thoughts and constantly thinking about future if he has supper ready or work issues. He reports a history of panic attacks will get to the point where he feels nauseous and feels chest pain. He reports last panic attack was on Sunday, reports on average once a week.  Patient reports a history of being yelled at as a kid. He reports trauma as losing people in his life, reports losing his stepdad in May 2000 and maternal grandfather in Sept 2000. He reports no relationship with his biological dad. Patient reports no intrusive symptoms negative alteration in cognition in mood as a result of the trauma, avoidance and increase in arousal and reactivity, and intrusive symptoms.  Patient reports a history of auditory and visual hallucinations as sometimes hearing classical music in his head and sometimes hearing people are calling his name when they are not.   Patient reports a history of obsessive thoughts of his children dying. He reports it will last for around 15-20 minutes. He reports will sometimes randomly think about it or will come about when he is triggered by seeing something online. He reports that  he will try to find something else to catch his attention and can take a few minutes. He reports talking with his kids will help calm him down. He denies compulsive actions.   He reports diagnosed with ADHD in 3rd grade. He reports he was on dexedrine at that time. He reports he took this 2-3 years until his stepdad died and then his mom  went to using drugs. He reports he has a GED and graduated a year before his class from [employer]. He reports he was able to finish class with smaller class and teacher. He reports he wasn't interested in classes in high school. He reports mainly being in regular classes but was only in an extra language class in 6th grade. He reports he got suspended from high school after argument with teacher. He reports at work when he has to do stuff on excel and reports he feels bored so he has difficulty concentrating with technology. He denies making careless mistakes at work. He denies difficulty organizing tasks. He does report losing things necessary for tasks. He reports easily distracted by extraneous stimuli. He reports being forgetful in paying bills and keeping appointments. He reports that he is very fidgety and restless and he reports difficulty with waiting and will sometimes interrupt conversation. He reports having diffuclty with waiting his turn to talk since young. He is unsure if he had psychological testing in the past. He reports only being interested in things that he wants to be interested in.   For medications, he reports citalopram  helped initially, has been taking for 5 years. He has been taking wellbutrin  for 2 years and notices increased irritability when he is not taking wellbutrin ..   Past Psychiatric History:  Diagnoses: MDD, ADHD, GAD  Medication trials: wellbutrin  XL 450, citalopram  40, zoloft  (over 10 years on max amount, stopped working), buspar  (reports ineffective, took for 6 months, felt irritable), cymbalta (can't remember but reports increased edgy) Previous psychiatrist/therapist: [facility] psychiatrist and therapist at [facility] when younger. Hospitalizations: none Suicide attempts: none SIB: none Hx of violence towards others: none Current access to guns: owns a gun, is locked and has trigger lock Hx of trauma/abuse: as above   Substance Abuse History in the last 12 months:  Yes.    He smokes cannabis once or twice a week to relax.  He reports using nicotine pouches (1-2) every day.   Past Medical History:  Past Medical History:  Diagnosis Date   Anxiety    Diabetes mellitus without complication (HCC)    Hypertension     Past Surgical History:  Procedure Laterality Date   MOUTH SURGERY     Medical Dx: T2DM, HTN Medications: metformin , amlodipine , olmesartan , chlorthalidone  Trauma: reports played football up to high school and likely had concussions  Seizures: denies   Family Psychiatric History:  Mother with history of substance abuse issues, bipolar, schizophrenia, depression Father:  unknown  Brother: substance abuse issues      Family History:  Family History  Problem Relation Age of Onset   COPD Mother    Heart disease Mother    Hypertension Mother    Fibromyalgia Mother    Diabetes Mother    Drug abuse Father    Alcohol abuse Father    Hypertension Sister    Heart disease Maternal Grandmother    Diabetes Maternal Grandfather    Kidney disease Maternal Grandfather     Social History:   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, [employer] Housing: lives with wife and 2  children  Children: 2 children, most recent born 6 months ago  Marital Status: married   Social History   Socioeconomic History   Marital status: Married    Spouse name: Not on file   Number of children: Not on file   Years of education: GED   Highest education level: GED or equivalent  Occupational History   Occupation: [employer]    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, 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  Tobacco Use   Smoking status: Every Day    Current packs/day: 0.20    Average packs/day: 0.2 packs/day for 10.0 years (2.0 ttl pk-yrs)    Types: Cigarettes   Smokeless tobacco: Former   Tobacco comments:    Vapes and some smokeless tobacco  Substance and Sexual Activity   Alcohol use: Yes    Alcohol/week: 1.0 standard drink of alcohol    Types: 1 Cans of beer per week     Comment: rare   Drug use: No   Sexual activity: Not on file  Other Topics Concern   Not on file  Social History Narrative   Not on file   Social Drivers of Health   Tobacco Use: High Risk (11/21/2023)   Patient History    Smoking Tobacco Use: Every Day    Smokeless Tobacco Use: Former    Passive Exposure: Not on Actuary Strain: Patient Declined (11/21/2023)   Overall Financial Resource Strain (CARDIA)    Difficulty of Paying Living Expenses: Patient declined  Food Insecurity: Patient Declined (11/21/2023)   Epic    Worried About Programme Researcher, Broadcasting/film/video in the Last Year: Patient declined    Barista in the Last Year: Patient declined  Transportation Needs: Patient Declined (11/21/2023)   Epic    Lack of Transportation (Medical): Patient declined    Lack of Transportation (Non-Medical): Patient declined  Physical Activity: Unknown (11/21/2023)   Exercise Vital Sign    Days of Exercise per Week: Patient declined    Minutes of Exercise per Session: Not on file  Stress: Patient Declined (11/21/2023)   [employer] of Occupational Health - Occupational Stress Questionnaire    Feeling of Stress: Patient declined  Social Connections: Unknown (11/21/2023)   Social Connection and Isolation Panel    Frequency of Communication with Friends and Family: Patient declined    Frequency of Social Gatherings with Friends and Family: Patient declined    Attends Religious Services: Patient declined    Active Member of Clubs or Organizations: Patient declined    Attends Banker Meetings: Not on file    Marital Status: Married  Depression (PHQ2-9): High Risk (11/01/2023)   Depression (PHQ2-9)    PHQ-2 Score: 15  Alcohol Screen: Low Risk (11/21/2023)   Alcohol Screen    Last Alcohol Screening Score (AUDIT): 0  Housing: Patient Declined (11/21/2023)   Epic    Unable to Pay for Housing in the Last Year: Patient declined    Number of Times Moved in the Last  Year: Not on file    Homeless in the Last Year: Patient declined  Utilities: Not on file  Health Literacy: Not on file    Additional Social History: updated  Allergies:  Allergies[1]  Current Medications: Current Outpatient Medications  Medication Sig Dispense Refill   amLODipine  (NORVASC ) 10 MG tablet Take 1 tablet (10 mg total) by mouth daily. 90 tablet 1   buPROPion  (WELLBUTRIN  XL) 150 MG 24 hr tablet Take 1 tablet (150 mg total) by mouth daily.  90 tablet 1   buPROPion  (WELLBUTRIN  XL) 300 MG 24 hr tablet Take 1 tablet (300 mg total) by mouth daily. 90 tablet 1   chlorthalidone  (HYGROTON ) 25 MG tablet Take 0.5 tablets (12.5 mg total) by mouth daily. 45 tablet 1   citalopram  (CELEXA ) 20 MG tablet Take 1 tablet (20 mg total) by mouth daily. 7 tablet 0   escitalopram  (LEXAPRO ) 10 MG tablet Take 1 tablet (10 mg total) by mouth daily. 30 tablet 0   escitalopram  (LEXAPRO ) 5 MG tablet Take 1 tablet (5 mg total) by mouth daily. 7 tablet 0   glucosamine-chondroitin 500-400 MG tablet Take 1 tablet 3 (three) times daily by mouth. (Patient not taking: Reported on 11/21/2023)     metFORMIN  (GLUCOPHAGE ) 1000 MG tablet Take 1 tablet (1,000 mg total) by mouth 2 (two) times daily with a meal. 60 tablet 5   olmesartan  (BENICAR ) 40 MG tablet Take 1 tablet (40 mg total) by mouth daily. 90 tablet 1   propranolol  (INDERAL ) 10 MG tablet Take 1 tablet (10 mg total) by mouth daily as needed (anxiety, panic attacks, sleep). 60 tablet 0   Semaglutide ,0.25 or 0.5MG /DOS, (OZEMPIC , 0.25 OR 0.5 MG/DOSE,) 2 MG/1.5ML SOPN Inject 0.5 mg into the skin once a week. 4.5 mL 1   No current facility-administered medications for this visit.   ROS: Review of Systems Respiratory:  Negative for shortness of breath.   Cardiovascular:  Negative for chest pain.  Gastrointestinal:  Negative for abdominal pain, constipation, diarrhea, nausea and vomiting.  Neurological:  Negative for headaches.   Objective:  Psychiatric  Specialty Exam: There were no vitals taken for this visit.There is no height or weight on file to calculate BMI.  General Appearance: Casual, wearing hoodie  Eye Contact:  Good  Speech:  Clear and Coherent  Volume:  Normal  Mood:  Irritable  Affect:  Appropriate  Thought Content: Logical   Suicidal Thoughts:  No  Homicidal Thoughts:  No  Thought Process:  Overall Linear, intermittently circumstantial  Orientation:  Full (Time, Place, and Person)    Memory:  Grossly intact   Judgment:  Fair  Insight:  Fair  Concentration:  Concentration: Poor and Attention Span: Poor  Recall:  not formally assessed   Fund of Knowledge: Fair  Language: Fair  Psychomotor Activity:  Restlessness  Akathisia:  No  AIMS (if indicated): not done  Assets:  Communication Skills Desire for Improvement Financial Resources/Insurance Housing Intimacy Resilience Vocational/Educational  ADL's:  Intact  Cognition: WNL  Sleep:  Fair   PE: General: well-appearing; no acute distress  Pulm: no increased work of breathing on room air  Strength & Muscle Tone: within normal limits Neuro: no focal neurological deficits observed  Gait & Station: normal  Metabolic Disorder Labs: Lab Results  Component Value Date   HGBA1C 6.6 (H) 11/01/2023   MPG 143 11/01/2023   MPG 120 12/22/2016   No results found for: PROLACTIN Lab Results  Component Value Date   CHOL 164 11/01/2023   TRIG 144 11/01/2023   HDL 45 11/01/2023   CHOLHDL 3.6 11/01/2023   VLDL 25 10/18/2015   LDLCALC 95 11/01/2023   LDLCALC 88 12/22/2016   No results found for: TSH  Therapeutic Level Labs: No results found for: LITHIUM No results found for: CBMZ No results found for: VALPROATE  Screenings:  PHQ2-9    Flowsheet Row Office Visit from 11/01/2023 in [facility] Office Visit from 12/22/2016 in [facility] Visit from 10/18/2015 in [facility]  PHQ-2 Total Score 4 0 1  PHQ-9 Total Score 15 -- --    DIVA Assessment   DIVA 2.0 Assessment administered 02/26/24   Part 1: symptoms of attention-deficit          Adulthood        Childhood  A1.           A2. A3.                                   A4.  A5. A6.        A7. A8.                                    A9.            Part 2: symptoms of hyperactivity-impulsivity         Adulthood        Childhood  H1.                       H2. H3. H4. H5.  H6. H7.  H8. H9.  Part 1: Symptoms of attention deficit A1: Often fails to pay close attention to details or makes careless mistakes in schoolwork, work or during other activities - childhood  adulthood  *** A2: Often has difficulty sustaining attention in tasks or play- childhood  adulthood *** A3: Often does not seem to listen when spoken to directly- childhood  adulthood *** A4: Often does not follow through on instructions and fails to finish schoolwork, chores, or duties in the workplace- childhood  adulthood *** A5: Often has difficulty organizing tasks and activities- childhood  adulthood *** A6: Often avoids, dislikes, or is reluctant to engage in past that require sustained mental effort- childhood  adulthood *** A7: Often loses things necessary for tasks or activities- childhood  adulthood *** A8: Often easily distracted by extraneous stimuli- childhood  adulthood *** A9: Often forgetful in daily activities- childhood  adulthood ***  Part 2: Symptoms of hyperactivity-impulsivity  H/I 1: Often fidgets with hands or feet or squirms in seat- childhood  adulthood  ***  H/I 2: Often leaves seat in classroom or in other situations in which remaining seated is expected- childhood  adulthood  ***  H/I 3: Often runs about or climbs excessively in situations in which it is inappropriate- childhood  adulthood  ***  H/I 4: Often has difficulty playing or engaging in leisure activities  quietly- childhood  adulthood  ***  H/I 5: Is often on the go or often acts as if driven by a motor- childhood  adulthood  ***  H/I 6: Often talks excessively- childhood  adulthood  ***  H/I 7: Often blurts out answers before questions have been completed- childhood  adulthood  ***  H/I 8: Often has difficulty waiting turn- childhood  adulthood  ***  H/I 9: Often interrupts or intrudes on others- childhood  adulthood  ***   DSM criteria A: Persistent pattern of inattention and/or hyperactivity-impulsivity that interferes with functioning of 6 or more symptoms that persisted for at least 6 months? *** DSM criteria B: Several of the symptoms were present prior to age 63? *** DSM criteria C: Several symptoms are present in 2 or more settings? ***  DSM criteria D: There is clear evidence that the symptoms interfere with or reduce the quality of social, academic, or occupational functioning? *** DSM criteria E: The symptoms do not occur exclusively during the course of a psychotic disorder and are not better explained by another mental disorder (mood, anxiety, dissociation, personality, or substances)? ***  Collaboration of Care: Collaboration of Care: Medication Management AEB attending MD  Patient/Guardian was advised Release of Information must be obtained prior to any record release in order to collaborate their care with an outside provider. Patient/Guardian was advised if they have not already done so to contact the registration department to sign all necessary forms in order for us  to release information regarding their care.   Consent: Patient/Guardian gives verbal consent for treatment and assignment of benefits for services provided during this visit. Patient/Guardian expressed understanding and agreed to proceed.   Jasmen Emrich, MD, PGY-3 1/22/20262:21 PM        [1]  Allergies Allergen Reactions   Celestone [Betamethasone] Other (See Comments)    very angry and  combative    Prednisone Hives   Toradol  [Ketorolac  Tromethamine ]    "

## 2024-03-03 ENCOUNTER — Ambulatory Visit (HOSPITAL_COMMUNITY): Admitting: Psychiatry

## 2024-03-05 ENCOUNTER — Telehealth (HOSPITAL_COMMUNITY): Payer: Self-pay | Admitting: Psychiatry

## 2024-03-05 ENCOUNTER — Ambulatory Visit (HOSPITAL_COMMUNITY): Admitting: Psychiatry

## 2024-03-05 NOTE — Telephone Encounter (Signed)
 Called patient regarding scheduled appointment today at [phone]. Patient forgot about appointment. Rescheduled this diagnostic appointment to March 2nd at 10:30am.  Discussed the importance of cessation from substances prior to testing.   Also, reminded patient of upcoming med management and therapy appointment.

## 2024-03-13 ENCOUNTER — Ambulatory Visit (HOSPITAL_COMMUNITY): Admitting: Licensed Clinical Social Worker

## 2024-03-13 ENCOUNTER — Encounter: Payer: Self-pay | Admitting: Adult Health

## 2024-03-13 ENCOUNTER — Ambulatory Visit: Payer: Self-pay | Admitting: Adult Health

## 2024-03-13 VITALS — BP 132/82 | HR 72 | Temp 97.9°F | Ht 69.0 in | Wt 251.2 lb

## 2024-03-13 DIAGNOSIS — F331 Major depressive disorder, recurrent, moderate: Secondary | ICD-10-CM

## 2024-03-13 DIAGNOSIS — F900 Attention-deficit hyperactivity disorder, predominantly inattentive type: Secondary | ICD-10-CM

## 2024-03-13 DIAGNOSIS — Z23 Encounter for immunization: Secondary | ICD-10-CM

## 2024-03-13 DIAGNOSIS — I10 Essential (primary) hypertension: Secondary | ICD-10-CM

## 2024-03-13 DIAGNOSIS — R2232 Localized swelling, mass and lump, left upper limb: Secondary | ICD-10-CM

## 2024-03-13 DIAGNOSIS — E11628 Type 2 diabetes mellitus with other skin complications: Secondary | ICD-10-CM

## 2024-03-13 DIAGNOSIS — F410 Panic disorder [episodic paroxysmal anxiety] without agoraphobia: Secondary | ICD-10-CM

## 2024-03-13 NOTE — Progress Notes (Unsigned)
 "  [facility]  Provider:  Jereld Serum DNP  Code Status: ***  Goals of Care:     05/27/2016   11:24 AM  Advanced Directives  Does Patient Have a Medical Advance Directive? No      Data saved with a previous flowsheet row definition     Chief Complaint  Patient presents with   Medical Management of Chronic Issues    3 month follow up     Discussed the use of AI scribe software for clinical note transcription with the patient, who gave verbal consent to proceed.  History of Present Illness     HPI: Patient is a 39 y.o. male seen today for an acute visit for    Lab Results  Component Value Date   HGBA1C 6.6 (H) 11/01/2023     Past Medical History:  Diagnosis Date   Anxiety    Diabetes mellitus without complication (HCC)    Hypertension     Past Surgical History:  Procedure Laterality Date   MOUTH SURGERY      Allergies[1]  Outpatient Encounter Medications as of 03/13/2024  Medication Sig   amLODipine  (NORVASC ) 10 MG tablet Take 1 tablet (10 mg total) by mouth daily.   buPROPion  (WELLBUTRIN  XL) 150 MG 24 hr tablet Take 1 tablet (150 mg total) by mouth daily.   buPROPion  (WELLBUTRIN  XL) 300 MG 24 hr tablet Take 1 tablet (300 mg total) by mouth daily.   chlorthalidone  (HYGROTON ) 25 MG tablet Take 0.5 tablets (12.5 mg total) by mouth daily.   escitalopram  (LEXAPRO ) 10 MG tablet Take 1 tablet (10 mg total) by mouth daily.   metFORMIN  (GLUCOPHAGE ) 1000 MG tablet Take 1 tablet (1,000 mg total) by mouth 2 (two) times daily with a meal.   olmesartan  (BENICAR ) 40 MG tablet Take 1 tablet (40 mg total) by mouth daily.   propranolol  (INDERAL ) 10 MG tablet Take 1 tablet (10 mg total) by mouth daily as needed (anxiety, panic attacks, sleep).   Semaglutide ,0.25 or 0.5MG /DOS, (OZEMPIC , 0.25 OR 0.5 MG/DOSE,) 2 MG/1.5ML SOPN Inject 0.5 mg into the skin once a week.   citalopram  (CELEXA ) 20 MG tablet Take 1 tablet (20 mg total) by mouth daily. (Patient not taking:  Reported on 03/13/2024)   escitalopram  (LEXAPRO ) 5 MG tablet Take 1 tablet (5 mg total) by mouth daily. (Patient not taking: Reported on 03/13/2024)   glucosamine-chondroitin 500-400 MG tablet Take 1 tablet 3 (three) times daily by mouth. (Patient not taking: Reported on 03/13/2024)   No facility-administered encounter medications on file as of 03/13/2024.    Review of Systems:  Review of Systems  Constitutional:  Negative for activity change, appetite change and fever.  HENT:  Negative for sore throat.   Eyes: Negative.   Cardiovascular:  Negative for chest pain and leg swelling.  Gastrointestinal:  Negative for abdominal distention, diarrhea and vomiting.  Genitourinary:  Negative for dysuria, frequency and urgency.  Skin:  Negative for color change.  Neurological:  Negative for dizziness and headaches.  Psychiatric/Behavioral:  Negative for behavioral problems and sleep disturbance. The patient is not nervous/anxious.     Health Maintenance  Topic Date Due   COVID-19 Vaccine (1) Never done   OPHTHALMOLOGY EXAM  Never done   Diabetic kidney evaluation - Urine ACR  Never done   DTaP/Tdap/Td (1 - Tdap) Never done   Hepatitis B Vaccines 19-59 Average Risk (1 of 3 - 19+ 3-dose series) Never done   Influenza Vaccine  05/06/2024 (Originally 09/07/2023)  Pneumococcal Vaccine (1 of 2 - PCV) 10/31/2024 (Originally 09/07/2006)   HEMOGLOBIN A1C  04/30/2024   Diabetic kidney evaluation - eGFR measurement  10/31/2024   FOOT EXAM  10/31/2024   HPV VACCINES (No Doses Required) Completed   Hepatitis C Screening  Completed   HIV Screening  Completed   Meningococcal B Vaccine  Aged Out    Physical Exam: Vitals:   03/13/24 0841  BP: 132/82  Pulse: 72  Temp: 97.9 F (36.6 C)  TempSrc: Temporal  SpO2: 98%  Weight: 251 lb 3.2 oz (113.9 kg)  Height: 5' 9 (1.753 m)   Body mass index is 37.1 kg/m. Physical Exam Constitutional:      Appearance: Normal appearance.  HENT:     Head: Normocephalic  and atraumatic.     Mouth/Throat:     Mouth: Mucous membranes are moist.  Eyes:     Conjunctiva/sclera: Conjunctivae normal.  Cardiovascular:     Rate and Rhythm: Normal rate and regular rhythm.     Pulses: Normal pulses.     Heart sounds: Normal heart sounds.  Pulmonary:     Effort: Pulmonary effort is normal.     Breath sounds: Normal breath sounds.  Abdominal:     General: Bowel sounds are normal.     Palpations: Abdomen is soft.  Musculoskeletal:        General: No swelling. Normal range of motion.     Cervical back: Normal range of motion.  Skin:    General: Skin is warm and dry.  Neurological:     General: No focal deficit present.     Mental Status: He is alert and oriented to person, place, and time.  Psychiatric:        Mood and Affect: Mood normal.        Behavior: Behavior normal.        Thought Content: Thought content normal.        Judgment: Judgment normal.     Labs reviewed: Basic Metabolic Panel: Recent Labs    11/01/23 1614  NA 140  K 4.0  CL 104  CO2 30  GLUCOSE 112*  BUN 13  CREATININE 0.94  CALCIUM 9.1   Liver Function Tests: Recent Labs    11/01/23 1614  AST 37  ALT 63*  BILITOT 0.3  PROT 7.4   No results for input(s): LIPASE, AMYLASE in the last 8760 hours. No results for input(s): AMMONIA in the last 8760 hours. CBC: Recent Labs    11/01/23 1614  WBC 12.2*  NEUTROABS 7,491  HGB 14.8  HCT 43.7  MCV 85.2  PLT 308   Lipid Panel: Recent Labs    11/01/23 1614  CHOL 164  HDL 45  LDLCALC 95  TRIG 144  CHOLHDL 3.6   Lab Results  Component Value Date   HGBA1C 6.6 (H) 11/01/2023    Procedures since last visit: No results found.  Assessment/Plan Assessment and Plan Assessment & Plan     ***   Labs/tests ordered:  * No order type specified *   No follow-ups on file.  Shanayah Kaffenberger Medina-Vargas, NP    [1]  Allergies Allergen Reactions   Celestone [Betamethasone] Other (See Comments)    very angry  and combative    Prednisone Hives   Toradol  [Ketorolac  Tromethamine ]    "

## 2024-03-14 ENCOUNTER — Ambulatory Visit: Payer: Self-pay | Admitting: Adult Health

## 2024-03-14 LAB — MICROALBUMIN / CREATININE URINE RATIO
Creatinine, Urine: 166 mg/dL (ref 20–320)
Microalb Creat Ratio: 4 mg/g{creat}
Microalb, Ur: 0.6 mg/dL

## 2024-03-14 LAB — BASIC METABOLIC PANEL WITH GFR
BUN: 12 mg/dL (ref 7–25)
CO2: 32 mmol/L (ref 20–32)
Calcium: 9.2 mg/dL (ref 8.6–10.3)
Chloride: 99 mmol/L (ref 98–110)
Creat: 0.92 mg/dL (ref 0.60–1.26)
Glucose, Bld: 164 mg/dL — ABNORMAL HIGH (ref 65–139)
Potassium: 3.9 mmol/L (ref 3.5–5.3)
Sodium: 138 mmol/L (ref 135–146)
eGFR: 111 mL/min/{1.73_m2}

## 2024-03-14 NOTE — Progress Notes (Signed)
-    electrolytes, kidney function, and urine microalbumin creatinine, all normal

## 2024-03-20 ENCOUNTER — Ambulatory Visit (HOSPITAL_COMMUNITY): Admitting: Licensed Clinical Social Worker

## 2024-03-26 ENCOUNTER — Ambulatory Visit (HOSPITAL_COMMUNITY): Admitting: Psychiatry

## 2024-04-07 ENCOUNTER — Ambulatory Visit (HOSPITAL_COMMUNITY): Admitting: Psychiatry

## 2024-04-24 ENCOUNTER — Ambulatory Visit (HOSPITAL_COMMUNITY): Admitting: Licensed Clinical Social Worker

## 2024-06-16 ENCOUNTER — Ambulatory Visit: Admitting: Adult Health
